# Patient Record
Sex: Female | Born: 1970 | Race: Black or African American | Hispanic: No | Marital: Married | Smoking: Former smoker
Health system: Southern US, Community
[De-identification: ages and names within clinical notes are randomized; demographics above are authoritative.]

## PROBLEM LIST (undated history)

## (undated) DIAGNOSIS — I10 Essential (primary) hypertension: Secondary | ICD-10-CM

## (undated) DIAGNOSIS — D649 Anemia, unspecified: Secondary | ICD-10-CM

## (undated) DIAGNOSIS — N938 Other specified abnormal uterine and vaginal bleeding: Secondary | ICD-10-CM

## (undated) DIAGNOSIS — D219 Benign neoplasm of connective and other soft tissue, unspecified: Secondary | ICD-10-CM

## (undated) DIAGNOSIS — R42 Dizziness and giddiness: Secondary | ICD-10-CM

## (undated) DIAGNOSIS — E559 Vitamin D deficiency, unspecified: Secondary | ICD-10-CM

## (undated) DIAGNOSIS — R7303 Prediabetes: Secondary | ICD-10-CM

## (undated) DIAGNOSIS — E7800 Pure hypercholesterolemia, unspecified: Secondary | ICD-10-CM

## (undated) DIAGNOSIS — T7840XA Allergy, unspecified, initial encounter: Secondary | ICD-10-CM

## (undated) DIAGNOSIS — Z789 Other specified health status: Secondary | ICD-10-CM

## (undated) DIAGNOSIS — Z87891 Personal history of nicotine dependence: Secondary | ICD-10-CM

## (undated) DIAGNOSIS — M549 Dorsalgia, unspecified: Secondary | ICD-10-CM

## (undated) HISTORY — DX: Prediabetes: R73.03

## (undated) HISTORY — DX: Allergy, unspecified, initial encounter: T78.40XA

## (undated) HISTORY — DX: Anemia, unspecified: D64.9

## (undated) HISTORY — DX: Vitamin D deficiency, unspecified: E55.9

## (undated) HISTORY — PX: ABDOMINAL HYSTERECTOMY: SHX81

## (undated) HISTORY — PX: TUBAL LIGATION: SHX77

## (undated) HISTORY — DX: Dizziness and giddiness: R42

## (undated) HISTORY — DX: Pure hypercholesterolemia, unspecified: E78.00

## (undated) HISTORY — DX: Dorsalgia, unspecified: M54.9

## (undated) HISTORY — DX: Essential (primary) hypertension: I10

---

## 1998-04-09 ENCOUNTER — Other Ambulatory Visit: Admission: RE | Admit: 1998-04-09 | Discharge: 1998-04-09 | Payer: Self-pay | Admitting: *Deleted

## 1998-06-26 ENCOUNTER — Ambulatory Visit (HOSPITAL_COMMUNITY): Admission: RE | Admit: 1998-06-26 | Discharge: 1998-06-26 | Payer: Self-pay | Admitting: Gastroenterology

## 1998-09-24 ENCOUNTER — Other Ambulatory Visit: Admission: RE | Admit: 1998-09-24 | Discharge: 1998-09-24 | Payer: Self-pay | Admitting: *Deleted

## 1999-08-12 ENCOUNTER — Emergency Department (HOSPITAL_COMMUNITY): Admission: EM | Admit: 1999-08-12 | Discharge: 1999-08-12 | Payer: Self-pay | Admitting: Emergency Medicine

## 2001-08-29 ENCOUNTER — Encounter: Payer: Self-pay | Admitting: Internal Medicine

## 2001-08-29 ENCOUNTER — Encounter: Admission: RE | Admit: 2001-08-29 | Discharge: 2001-08-29 | Payer: Self-pay | Admitting: Internal Medicine

## 2001-10-17 HISTORY — PX: MYOMECTOMY: SHX85

## 2001-11-21 ENCOUNTER — Other Ambulatory Visit: Admission: RE | Admit: 2001-11-21 | Discharge: 2001-11-21 | Payer: Self-pay | Admitting: Obstetrics and Gynecology

## 2002-09-17 ENCOUNTER — Encounter (INDEPENDENT_AMBULATORY_CARE_PROVIDER_SITE_OTHER): Payer: Self-pay

## 2002-09-17 ENCOUNTER — Inpatient Hospital Stay (HOSPITAL_COMMUNITY): Admission: RE | Admit: 2002-09-17 | Discharge: 2002-09-19 | Payer: Self-pay | Admitting: Obstetrics and Gynecology

## 2002-12-18 ENCOUNTER — Other Ambulatory Visit: Admission: RE | Admit: 2002-12-18 | Discharge: 2002-12-18 | Payer: Self-pay | Admitting: Obstetrics and Gynecology

## 2004-03-03 ENCOUNTER — Other Ambulatory Visit: Admission: RE | Admit: 2004-03-03 | Discharge: 2004-03-03 | Payer: Self-pay | Admitting: Obstetrics and Gynecology

## 2004-05-12 ENCOUNTER — Ambulatory Visit: Admission: RE | Admit: 2004-05-12 | Discharge: 2004-05-12 | Payer: Self-pay | Admitting: *Deleted

## 2004-08-30 ENCOUNTER — Encounter (INDEPENDENT_AMBULATORY_CARE_PROVIDER_SITE_OTHER): Payer: Self-pay | Admitting: Specialist

## 2004-08-30 ENCOUNTER — Ambulatory Visit (HOSPITAL_COMMUNITY): Admission: AD | Admit: 2004-08-30 | Discharge: 2004-08-30 | Payer: Self-pay | Admitting: Obstetrics and Gynecology

## 2005-06-24 ENCOUNTER — Inpatient Hospital Stay (HOSPITAL_COMMUNITY): Admission: AD | Admit: 2005-06-24 | Discharge: 2005-06-24 | Payer: Self-pay | Admitting: Obstetrics & Gynecology

## 2005-07-05 ENCOUNTER — Encounter: Admission: RE | Admit: 2005-07-05 | Discharge: 2005-07-05 | Payer: Self-pay | Admitting: Obstetrics and Gynecology

## 2005-07-09 ENCOUNTER — Inpatient Hospital Stay (HOSPITAL_COMMUNITY): Admission: AD | Admit: 2005-07-09 | Discharge: 2005-07-09 | Payer: Self-pay | Admitting: Obstetrics and Gynecology

## 2005-09-06 ENCOUNTER — Inpatient Hospital Stay (HOSPITAL_COMMUNITY): Admission: RE | Admit: 2005-09-06 | Discharge: 2005-09-08 | Payer: Self-pay | Admitting: Obstetrics and Gynecology

## 2005-09-06 ENCOUNTER — Encounter (INDEPENDENT_AMBULATORY_CARE_PROVIDER_SITE_OTHER): Payer: Self-pay | Admitting: Specialist

## 2006-10-13 ENCOUNTER — Inpatient Hospital Stay (HOSPITAL_COMMUNITY): Admission: AD | Admit: 2006-10-13 | Discharge: 2006-10-14 | Payer: Self-pay | Admitting: Obstetrics & Gynecology

## 2006-10-23 ENCOUNTER — Encounter: Admission: RE | Admit: 2006-10-23 | Discharge: 2006-10-23 | Payer: Self-pay | Admitting: Obstetrics and Gynecology

## 2006-10-26 ENCOUNTER — Inpatient Hospital Stay (HOSPITAL_COMMUNITY): Admission: AD | Admit: 2006-10-26 | Discharge: 2006-10-26 | Payer: Self-pay | Admitting: Obstetrics and Gynecology

## 2006-12-05 ENCOUNTER — Inpatient Hospital Stay (HOSPITAL_COMMUNITY): Admission: AD | Admit: 2006-12-05 | Discharge: 2006-12-08 | Payer: Self-pay | Admitting: Obstetrics and Gynecology

## 2006-12-05 ENCOUNTER — Encounter (INDEPENDENT_AMBULATORY_CARE_PROVIDER_SITE_OTHER): Payer: Self-pay | Admitting: Specialist

## 2007-07-23 ENCOUNTER — Emergency Department (HOSPITAL_COMMUNITY): Admission: EM | Admit: 2007-07-23 | Discharge: 2007-07-23 | Payer: Self-pay | Admitting: Emergency Medicine

## 2007-09-21 ENCOUNTER — Encounter: Admission: RE | Admit: 2007-09-21 | Discharge: 2007-09-21 | Payer: Self-pay | Admitting: Occupational Medicine

## 2010-04-16 ENCOUNTER — Encounter: Admission: RE | Admit: 2010-04-16 | Discharge: 2010-04-16 | Payer: Self-pay | Admitting: Endocrinology

## 2010-04-23 ENCOUNTER — Encounter: Admission: RE | Admit: 2010-04-23 | Discharge: 2010-04-23 | Payer: Self-pay | Admitting: Obstetrics and Gynecology

## 2011-03-04 NOTE — Op Note (Signed)
NAMECAYLAN, [name]          ACCOUNT NO.:  [id number]   MEDICAL RECORD NO.:  [id number]          PATIENT TYPE:  MAT   LOCATION:  MATC                          [facility]ILITY:  [facility]   PHYSICIAN:  Maxie Better, M.D.DATE OF BIRTH:  Mar 18, 1971   DATE OF PROCEDURE:  08/30/2004  DATE OF DISCHARGE:                                 OPERATIVE REPORT   PREOPERATIVE DIAGNOSIS:  Incomplete spontaneous abortion.   PROCEDURE:  Suction dilation and evacuation.   POSTOPERATIVE DIAGNOSES:  Incomplete spontaneous abortion.   ANESTHESIA:  MAC, paracervical block.   SURGEON:  Maxie Better, M.D.   INDICATIONS:  A 39 year old gravida 2, para 0-0-1-0, female with a diagnosis  of missed abortion on August 27, 2004, who presented to the office on  August 30, 2004, with complaint of large clots and ongoing bleeding and  cramps.  Ultrasound in the office revealed that there was still tissue with  large sac within the uterus.  Options were reviewed with the patient.  The  patient opted for surgical management.  Risks and benefits of the procedure  had been explained to the patient and her husband.  The consent was signed.  The patient was transferred to the operating room.   DESCRIPTION OF PROCEDURE:  Under adequate monitored anesthesia, the patient  was placed in the dorsal lithotomy position.  She was sterilely prepped and  draped in the usual fashion.  The bladder was catheterized for a scant  amount of urine.  Examination under anesthesia revealed an irregular, 10  weeks' size, anteverted uterus.  Bivalve speculum was placed in the vagina.  Nesacaine 20 mL of 1% was injected paracervically.  The cervical os was  noted to be visually dilated, probably about 1 cm.  A single-tooth tenaculum  was placed on the anterior lip of the cervix.  The cervix was then serially  dilated up to #31 [facility] dilator.  A #7 mm curved suction cannula was  introduced into the cavity.  Products of conception were  obtained as was a  fair amount of fluid.  The cavity was then curetted, suctioned and when all  tissue was felt to have been removed, all instruments were then removed from  the vagina.  Specimen of products of conception were sent to pathology.  Estimated blood loss was minimal.  Complication was none.  The patient  tolerated the procedure well, was transferred to the recovery room in stable  condition.      [place]/MEDQ  D:  08/30/2004  T:  08/30/2004  Job:  [id number]

## 2011-03-04 NOTE — Op Note (Signed)
Sabrina, Copeland          ACCOUNT NO.:  [id number]   MEDICAL RECORD NO.:  [id number]          PATIENT TYPE:  INP   LOCATION:  9139                          [facility]   PHYSICIAN:  Maxie Better, M.D.DATE OF BIRTH:  Jan 21, 1971   DATE OF PROCEDURE:  09/06/2005  DATE OF DISCHARGE:                                 OPERATIVE REPORT   PREOPERATIVE DIAGNOSES:  1.  Previous myomectomy.  2.  Term gestation.  3.  Class A1 gestational diabetes.   PROCEDURES:  1.  Primary cesarean section, Kerr hysterotomy.  2.  Myomectomy.  3.  Lysis of adhesion.   POSTOPERATIVE DIAGNOSES:  1.  Previous myomectomy.  2.  Fibroid uterus.  3.  Term gestation.  4.  Class A1 gestational diabetes.  5.  Pelvic/Abdominal Adhesions   ANESTHESIA:  Spinal.   SURGEON:  Maxie Better, M.D.   ASSISTANT:  Richardean Sale, M.D.   INDICATIONS:  A 40 year old gravida 3, para 0, female at term with a  previous myomectomy, who is now at term and needs a primary cesarean  section.  Her prenatal course has been complicated by class A1 gestational  diabetes, diet-controlled.  Risks and benefits of the procedure have been  explained to the patient.  Consent was signed.  The patient was transferred  to the operating room.   PROCEDURE:  Under adequate spinal anesthesia, the patient was placed in a  supine position with a left lateral tilt.  She was sterilely prepped and  draped in the usual sterile fashion.  An indwelling Foley catheter was  sterilely placed.  Marcaine 0.25% with 10 mL was injected along the previous  Pfannenstiel skin incision.  The Pfannenstiel skin incision was then made,  carried down to the rectus fascia, rectus fascia incised in the midline and  extended bilaterally.  The rectus fascia was then bluntly and sharply  dissected off the rectus muscle in a superior and inferior fashion.  The  rectus muscle was split in the midline and the parietal peritoneum was  entered bluntly  and extended.  The lower uterine segment was noted to be  developed.  There was a subserosal fibroid noted and a transverse incision  was made to open the bladder flap.  The bladder was then bluntly dissected  off the lower uterine segment and displaced inferiorly using a bladder  retractor.  A curvilinear low transverse uterine incision was then made and  extended bilaterally using bandage scissors.  Artificial rupture of  membranes was done, clear fluid was noted.  A vacuum was subsequently used  to extract a live female infant who had a cord around the neck, but this was  loose and easily reducible, and the same cord around the right foot.  The  baby was delivered, bulb-suctioned on the abdomen.  The cord was clamped,  cut.  The baby was transferred to the waiting pediatricians, who assigned  Apgars of 9 and 9 at one and five minutes.  The placenta was spontaneous  intact.  The uterine cavity was cleaned of debris.  The uterine incision had  no extension.  However, on the right anterior  aspect of the incision a  fibroid was located(submucosal/intramural) in the entire portion of that  incision, which would not facilitate closure without its removal.  A cautery  was then utilized to open the endometrium and enucleate the fibroid from its  base.  The base was then closed with interrupted 0 Vicryl figure-of-eight  sutures within the endometrial cavity.  The uterine incision was then closed  in two layers, the first layer of 0 Monocryl running locked stitch.  A  second layer was imbricated using 0 Monocryl suture.  Bleeding on the right  angle was hemostased with a figure-of-eight suture with 0 Monocryl.  Good  hemostasis was noted.  Tubes and ovaries were noted to be normal  bilaterally.  Right fundal area had a fibroid with three smaller subserosal  fibroids, which were not removed.  There was an adhesion from the right  pelvic sidewall to the midbody of the uterus, which was then cauterized  and  lysed.  The paracolic gutters were cleaned of debris bilaterally.  The  uterine incision had good hemostasis.  The parietal peritoneum was then  closed with 2-0 Vicryl suture.  The undersurface of the rectus fascia was  inspected and small bleeders cauterized.  The rectus fascia was closed with  0 Vicryl x2.  The subcutaneous area as irrigated and small bleeders  cauterized, and the subcutaneous area was approximated using 2-0 plain  sutures.  The skin was then approximated using Ethicon staples.   SPECIMENS:  Placenta, not sent to pathology.  Myoma, sent to pathology.   ESTIMATED BLOOD LOSS:  700 mL.   INTRAOPERATIVE FLUID:  3500 mL crystalloid.   URINE OUTPUT:  150 mL of clear yellow urine.   Sponge and instrument count x2 were correct.  Complication was none.  Weight  of the baby was 8 pounds 1 ounce.  The patient was transferred to the  recovery room in stable condition.      Maxie Better, M.D.  Electronically Signed     /MEDQ  D:  09/06/2005  T:  09/06/2005  Job:  [id number]

## 2011-03-04 NOTE — Discharge Summary (Signed)
[name], [name]          ACCOUNT NO.:  [id number]   MEDICAL RECORD NO.:  [id number]          PATIENT TYPE:  INP   LOCATION:  9139                          [facility]   PHYSICIAN:  Maxie Better, M.D.DATE OF BIRTH:  05-15-1971   DATE OF ADMISSION:  09/06/2005  DATE OF DISCHARGE:  09/08/2005                                 DISCHARGE SUMMARY   ADMITTING DIAGNOSES:  1.  Previous myomectomy.  2.  Term gestation.  3.  Fibroid uterus.  4.  Class A1 gestational diabetes.   DISCHARGE DIAGNOSES:  1.  Term gestation, delivered.  2.  Class A1 gestational diabetes.  3.  Previous myomectomy.  4.  Fibroid uterus.  5.  Postoperative anemia.   PROCEDURE:  1.  Primary cesarean section.  2.  Myomectomy.  3.  Lysis of adhesions.   HISTORY OF PRESENT ILLNESS:  A 40 year old G79, P0 married black female at  term with a previous myomectomy, class A1 gestational diabetes at term  admitted for primary cesarean section due to inability to labor.  Patient is  known to have multiple uterine fibroids.  Her prenatal course had been  complicated by class A1 gestational diabetes and a preterm cervical change.   HOSPITAL COURSE:  The patient was admitted to [facility] for primary  cesarean section due to the inability to labor secondary to previous  myomectomy.  She was taken to the operating room where she delivered a live  female 8 pounds 1 ounce, Apgars of 9 and 9 via a low transverse uterine  incision.  There was a submucosal intramural fibroid present in the incision  line which necessitated removal in order to facilitate closure of the  uterine incision.  This myoma was removed and sent to pathology.  Incision  of the uterus was otherwise closed without incident.  There was cord around  the neck x1 and on the right foot of that baby at time of delivery.  The  patient had an uncomplicated postoperative course.  By postoperative day #2  she was tolerating a regular diet and was  requesting discharge.  Incision  had no erythema, induration, or exudate.  Her CBC on postoperative day #1  showed a hemoglobin of 9.9, hematocrit 30.7, white count 7.9.  She was  deemed well to be discharged home.   DISPOSITION:  Home.   CONDITION ON DISCHARGE:  Stable.   DISCHARGE FOLLOW-UP:  For staple removal in the office the next day and  follow-up at [facility] otherwise for postpartum p.r.n. four to six  weeks.   DISCHARGE INSTRUCTIONS:  Per the postpartum booklet given.   DISCHARGE MEDICATIONS:  1.  Motrin 600 mg every six hours p.r.n. pain.  2.  Prenatal vitamins one p.o. daily.  3.  Tylox one to two tablets every four to six hours p.r.n. pain.   The patient will have two-hour glucose tolerance test at eight weeks  postpartum check.      Maxie Better, M.D.  Electronically Signed     [place]/MEDQ  D:  09/26/2005  T:  09/26/2005  Job:  [id number]

## 2011-03-04 NOTE — Op Note (Signed)
NAME:  [name], [name]                    ACCOUNT NO.:  [id number]   MEDICAL RECORD NO.:  [id number]                   PATIENT TYPE:  INP   LOCATION:  9312                                 [facility]   PHYSICIAN:  Maxie Better, M.D.            DATE OF BIRTH:  10-01-1971   DATE OF PROCEDURE:  09/17/2002  DATE OF DISCHARGE:                                 OPERATIVE REPORT   PREOPERATIVE DIAGNOSES:  1. Pelvic pain.  2. Fibroid uterus.   PROCEDURES:  1. Examination under anesthesia.  2. Exploratory laparotomy.  3. Multiple myomectomies.   POSTOPERATIVE DIAGNOSES:  1. Pelvic pain.  2. Pedunculated fibroids.  3. Subserosal, intramural, and submucosal fibroids.   ANESTHESIA:  General.   SURGEON:  Maxie Better, M.D.   ASSISTANT:  Pershing Cox, M.D.   INDICATIONS FOR PROCEDURE:  This is a 40 year old gravida 1, para 0-0-1-0,  married black female with multiple uterine fibroids who has associated  pelvic pain and who now presents for surgical management.  Risks and  benefits of the procedure have been explained to the patient.  Consent was  signed.  The patient was transferred to the operating room.   PROCEDURE:  Under adequate general anesthesia, the patient was placed in the  supine position.  Examination under anesthesia revealed about a 14-week size  irregular uterus.  The patient was thoroughly prepped and draped including  the vagina, which was also prepped.  An indwelling Foley catheter was placed  sterilely.  A bivalve speculum was placed in the vagina.  A single-tooth  tenaculum was placed on the anterior lip of the cervix.  An acorn cannula  was introduced into the cervical os and attached to the tenaculum for  transmission of the dilute solution of methylene blue for ascertaining  endometrial cavity entry during the surgery.  The bivalve speculum was then  removed.  The patient having now been sterilely prepped and draped underwent  a  Pfannenstiel skin incision after 0.25% Marcaine was injected along the  planned incision line and skin incision was then made, carried down to the  rectus fascia using Bovie cautery.  The rectus fascia was incised in the  midline and extended bilaterally.  The rectus fascia was bluntly and with  cautery dissected off the rectus muscle in superior and inferior fashion.  The rectus muscle was split in the midline.  The parietal peritoneum was  entered sharply and extended superiorly, inferiorly, and slightly lateral on  the right.  Inspection of the pelvis was notable for the fibroid that had  multiple pedunculated, fundal, anterior, and posterior fibroids.  The  largest pedunculated fibroid was at least 6 cm arising from the left  anterior fundal region close to the insertion of the fallopian tube.  The  uterus was then exteriorized.  Additional fibroids were noted of similar  size anteriorly and pedunculated as well as posterior and superior 3-4 cm  fibroids.  There was  a palpable right lower uterine segment close to the  cervical portion of a fibroid about 3.5 cm.  There was a lower uterine  segment anterior fibroid.  There were multiple smaller pedunculated fibroids  about a cm and then there were two palpable intramural/subserosal fibroids  palpable anteriorly.  Both ovaries are normal.  Both tubes appeared normal.  A dilute solution of Pitressin was injected on the base of the pedunculated  fibroids taking care to make sure that they were not close to the entry of  the fallopian tubes.  The pedunculated fibroids x3 were removed anteriorly.  There was a posterior pedunculated fibroid to the right adjacent to the  entry of the right fallopian tube and this was also carefully removed.  A  posterior fundal incision was made.  Several additional fibroids then  removed and a deeper fibroid was removed.  At this point, the methylene blue  was injected and that confirmed entry into the  endometrial cavity.  An  anterior vertical incision was made.  Multiple fibroids were removed through  that incision and the lower uterine segment fibroid was removed  subsequently, with additional fibroids removed from its base.  The right  posterior lower uterine segment fibroid was also removed and all these  fibroids having then being removed was closed, the deeper layers with 0  Vicryl figure-of-eight sutures and the skin approximated using 2-0 Monocryl  sutures.  Where not possible, the closure was done with primarily 0 Vicryl  suture in a baseball fashion.  Good hemostasis subsequently noted.  No other  fibroids were palpated.  The abdomen was then irrigated copiously.  Suction  of debris and Intercede placed anteriorly and posteriorly to protect the  tube as well as to decrease adhesions.  Due to the endometrial cavity being  entered, the patient will need a cesarean section for all future deliveries.  The parietal peritoneum was now closed.  The rectus fascia after being  inspected was closed with 0 Vicryl x2.  The subcutaneous area was irrigated,  suctioned, small bleeders cauterized, and the skin approximated using  Ethicon staples.  The specimens were myoma x23.  Estimated blood loss was  350 cc.  Intraoperative fluid was 1800 cc crystalloid.  Urine output was 150  cc of bluish colored urine consistent with methylene blue utilized during  her surgery.  Sponge and instrument counts x2 are correct.  Complications  none.  The patient tolerated the procedure well, was transferred to the  recovery room in stable condition.                                               Maxie Better, M.D.    [place]/MEDQ  D:  09/17/2002  T:  09/17/2002  Job:  [id number]

## 2011-03-04 NOTE — Discharge Summary (Signed)
[name], [name]          ACCOUNT NO.:  [id number]   MEDICAL RECORD NO.:  [id number]          PATIENT TYPE:  INP   LOCATION:  9104                          [facility]   PHYSICIAN:  Maxie Better, M.D.DATE OF BIRTH:  04-28-1971   DATE OF ADMISSION:  12/05/2006  DATE OF DISCHARGE:  12/08/2006                               DISCHARGE SUMMARY   ADMISSION DIAGNOSES:  1. Previous cesarean section,  2. desires sterilization.  3. Class A-1 gestational diabetes.  4. Term gestation   DISCHARGE DIAGNOSES:  1. Term gestation, delivered.  2. Class A-1 gestational diabetes.  3. Desires sterilization.  4. Previous cesarean section.  5. Fibroid uterus.   PROCEDURE:  1. Repeat cesarean section.  2. Modified Pomeroy tubal ligation.   HISTORY OF PRESENT ILLNESS:  A 40 year old gravida 4, para 1-0-2-1  married black female with a previous cesarean section due to prior  myomectomy, who is now a term for repeat cesarean section. The patient  also desires permanent sterilization. Her prenatal care has been  complicated by gestational diabetes, diet controlled   HOSPITAL COURSE:  The patient was admitted to [facility]. She was taken to the operating room. Repeat cesarean section  was performed as well as a modified Pomeroy tubal ligation. The patient  had a live female, 7 pounds 9 ounces, Apgar's of 9 and 9. A thin lower  uterine segment was noted at the time. A small right fundal sub-serosal  fibroid was noted. The right fallopian tube was adherent to the right  ovary and had to be lysed in order to perform the tubal ligation, but it  was otherwise performed. Postoperatively, the patient did well. She was  placed back on a regular diet.  A CBC on postoperative day 1 showed a hemoglobin of 11, hematocrit 32.2,  white count of 8.1, platelet count of 269,000. The pathology was  consistent with a segment of fallopian tube. By postoperative day 3, the  patient  was feeling well and was able to be discharged home. Her  incision showed no evidence of induration or exudate.   DISPOSITION:  home.   CONDITION ON DISCHARGE:  Stable.   DISCHARGE MEDICATIONS:  1. Percocet 1 tablet every 6 hours p.r.n. pain.  2. Ibuprofen 800 mg 1 p.o. q.8 hours p.r.n. pain.  3. Prenatal vitamins 1 p.o. daily.   DISCHARGE INSTRUCTIONS:  Per the post-partum booklet given.   FOLLOWUP:  At [facility] on Tuesday for staple removal and 6 weeks  post-partum otherwise.      Maxie Better, M.D.  Electronically Signed     [place]/MEDQ  D:  12/24/2006  T:  12/24/2006  Job:  [id number]

## 2011-03-04 NOTE — Consult Note (Signed)
[name], [name]          ACCOUNT NO.:  [id number]   MEDICAL RECORD NO.:  [id number]          PATIENT TYPE:  MAT   LOCATION:  MATC                          [facility]   PHYSICIAN:  Lenoard Aden, M.D.DATE OF BIRTH:  1970-11-05   DATE OF CONSULTATION:  DATE OF DISCHARGE:                                   CONSULTATION   CHIEF COMPLAINT:  Preterm labor.   HISTORY OF PRESENT ILLNESS:  She is a 40 year old African-American female  G3, P0, EDD of September 13, 2005 at 32-weeks gestation with preterm  contractions. She does have a history of preterm labor without cervical  change. She has not had betamethasone. She does have a history of borderline  Carbohydrate intolerance.   ALLERGIES:  She has no known drug allergies.   MEDICATIONS:  Prenatal vitamins.   HISTORY:  D&C in 1993. SAB with D&C in 2005. History of myomectomy in 2003  with questionable entry into the active saline, requiring a primary C-  section.   SOCIAL HISTORY:  She is a nonsmoker, non drinker. She denies domestic  physical violence.   FAMILY HISTORY:  Remarkable for heart disease.   PRENATAL LABS:  Blood type B positive. Rubella immune. Hepatitis  nonreactive. HIV declined. GBS has not been performed.   PHYSICAL EXAMINATION:  GENERAL:  She is a well-developed, well-nourished  African-American female in no acute distress.  HEENT:  Normal.  LUNGS:  Clear.  HEART:  Regular rhythm.  ABDOMEN:  Soft, gravid and nontender.  PELVIC:  Cervix is closed, 2.5 cm long. Presenting part is out of the  pelvis.  EXTREMITIES:  Show no cords.  NEUROLOGICAL EXAM:  Nonfocal.   IMPRESSION:  Thirty-two week intrauterine pregnancy with preterm  contractions, no cervical change.   PLAN:  Discharge home. Continue bed rest. Follow up in the office in 1 week.  Continue glucose management as per Dr. Cherly Hensen.      Lenoard Aden, M.D.  Electronically Signed     RJT/MEDQ  D:  07/09/2005  T:  07/09/2005  Job:   [id number]

## 2011-03-04 NOTE — H&P (Signed)
NAME:  [name], KITCHEN                       ACCOUNT NO.:  [id number]   MEDICAL RECORD NO.:  [id number]                   PATIENT TYPE:   LOCATION:                                       FACILITY:   PHYSICIAN:  Maxie Better, M.D.            DATE OF BIRTH:   DATE OF ADMISSION:  DATE OF DISCHARGE:                                HISTORY & PHYSICAL   CHIEF COMPLAINT:  Right lower quadrant pain, fibroid uterus.   HISTORY OF PRESENT ILLNESS:  This is a 40 year old gravida 1, para 0, 0, 1,  0, married black female, last menstrual period 08/26/02; who is now being  admitted for exploratory laparotomy, myomectomy secondary to pain, secondary  to her uterine fibroids. The patient has been on birth control pills until  decision was made for surgery.  The patient was seen in [facility]  by Dr. Merla Riches for pelvic pain. At that time, she had no nausea and  vomiting, or fever.  She had had some breakthrough bleeding as a result of  trying to suppress her cycle due to plans for honeymoon. Bowel movements  have been regular.  The patient had been using nonsteroidals for her pain  with marginal relief. The pain was nonradiating and has been on and off  since 08/06/02.  Ultrasound on 08/16/02 showed uterus that was 14.8 x 7.6 x  8.4 cm with multiple fibroids. Both ovaries were normal.  There was a  pedunculated fibroid measuring about 5 cm.  The patient would like to  proceed with surgical management.   PAST MEDICAL HISTORY:  Previous treatment for H. pylori.  D&C.   OBSTETRICAL HISTORY:  First trimester abortion, no problems.  GYN:  Fibroid  uterus, no problems.   ALLERGIES:  NO KNOWN DRUG ALLERGIES.   FAMILY HISTORY:  Maternal grandmother has hypertension.  No genital, colon  or breast cancer.   MEDICATIONS:  Multivitamins.   SOCIAL HISTORY:  Recently married.  Information systems major, working at  [employer]   REVIEW OF SYSTEMS:  Negative except as noted in  history of present illness.   PHYSICAL EXAMINATION:  VITAL SIGNS:  Blood pressure 118/66, pulse 80, weight  187 pounds.  GENERAL:  A well-developed, well nourished black female in no acute  distress.  SKIN:  No lesions.  HEENT:  Anicteric sclerae, pink conjunctivae, oropharynx negative.  LUNGS:  Clear to auscultation.  NODES:  No axillary or supraclavicular nodes palpable.  CARDIOVASCULAR:  Regular rate and rhythm without murmur.  BREASTS:  Soft, nontender, no palpable mass.  ABDOMEN:  Soft, uterus about 2 fingerbreadths below the umbilicus,  nontender.  PELVIC:  Vulva showed no lesions, vagina has no discharge, cervix closed,  uterus about 16 weeks' size, irregular. Adenex not palpable, given the  increased uterine size.  RECTAL:  Deferred.  EXTREMITIES:  No calf tenderness or edema.  BACK:  No CVA tenderness.   IMPRESSION:  Pelvic pain, fibroid  uterus.   PLAN:  Admission, exploratory laparotomy, myomectomy, antibiotic  prophylaxis, antiembolic stockings. The risks reviewed with the patient,  included, but not limited to infection, bleeding which may require blood  transfusion, risk of blood transfusions such as acute reaction, hepatitis  transmission 1 out of 3000, HIV transmission 1 out of 100,000 were  discussed, up to 30% chance of needing myomectomy in the future or  hysterectomy in the future, internal scar tissue which may cause infertility  and pelvic pain, bowel obstruction, injury to surrounding organs and  structures, possible need for cesarean section for future delivery;  Postop  criteria and criteria for hospital discharge reviewed. Patient also informed  that there is about a 2% chance of a need for hysterectomy. Also discussed  possible finding of adenomyoma. All questions were answered.                                                 Maxie Better, M.D.    [place]/MEDQ  D:  09/15/2002  T:  09/15/2002  Job:  [id number]

## 2011-03-04 NOTE — H&P (Signed)
[name], [name]          ACCOUNT NO.:  [id number]   MEDICAL RECORD NO.:  [id number]          PATIENT TYPE:  INP   LOCATION:  NA                            [facility]ILITY:  [facility]   PHYSICIAN:  Maxie Better, M.D.DATE OF BIRTH:  [birth year]   DATE OF ADMISSION:  09/06/2005  DATE OF DISCHARGE:                                HISTORY & PHYSICAL   CHIEF COMPLAINT:  Previous myomectomy, C-section scheduled.   HISTORY OF PRESENT ILLNESS:  This is a 40 year old G3, P0-0-2-0, married  black female, EDC of September 13, 2005, by ultrasound, who is now at 50  weeks' gestation, being admitted for a primary cesarean section secondary to  previous myomectomy.  The patient's history is notable for multiple  fibroids.  Her prenatal course has been complicated by class A1 gestational  diabetes, diet-controlled.  She has had good fetal movements, no  contractions, intact membranes.   PRENATAL CARE:  [facility], obstetrician Maxie Better, M.D.   PRENATAL LABORATORY DATA:  Blood type is B positive, antibody screen is  negative.  Hemoglobin electrophoresis is normal.  RPR is nonreactive.  Rubella is immune.  Hepatitis B surface antigen is negative.  HIV test was  declined.  GC and chlamydia cultures were negative.  One-hour glucose test  was 168, three-hour GTT was abnormal.  AFP for open neural tube defect was  normal.  Normal ultra screen.  GBS culture was done on October 27.  Anatomic  fetal survey done at 20 weeks was normal with the exception of shortened  cervical length, which was followed with the patient closely without any  further change.   PAST MEDICAL HISTORY:  No known drug allergies.   Medicines are prenatal vitamins, Ambien.   Medical history:  Uterine fibroids, migraine.   Surgical history:  Myomectomy in 2003.  D&E x2.   OBSTETRIC HISTORY:  SAB November 2005 and 1993.   FAMILY HISTORY:  Noncontributory.   SOCIAL HISTORY:  Married, nonsmoker.  Billing service  coordinator, [employer].   REVIEW OF SYSTEMS:  Negative.   PHYSICAL EXAMINATION:  GENERAL:  A well-developed, well-nourished, gravid  black female in no acute distress.  VITAL SIGNS:  Blood pressure 110/70, fetal heart rate was 136.  SKIN:  No lesions.  HEENT:  Anicteric sclerae, pink conjunctivae.  Oropharynx negative.  CARDIAC:  Regular rate and rhythm without murmur.  CHEST:  Lungs were clear to auscultation.  BREASTS:  Soft, nontender, no palpable mass.  ABDOMEN:  Gravid.  A low transverse scar noted.  PELVIC:  Cervix is closed, about 2 cm long.  Presenting part out of the  pelvis.  EXTREMITIES:  1+ edema.   IMPRESSION:  1.  Term gestation.  2.  Previous myomectomy requiring cesarean section for delivery.  3.  Class A1 gestational diabetes.   PLAN:  Admission, primary cesarean section, routine admission labs.  The  risks of the procedure were explained to the patient, including but not  limited to infection, bleeding, injury to the surrounding organ structures  such as the bladder, bowel or ureter, internal scar tissue from previous  surgery which may cause pain in  the future and/or bowel obstruction,  cesarean section necessary in the future, possible need for blood  transfusion.  The risks of blood transfusion including HIV transmission,  acute reaction, hepatitis reviewed.  Postop care and criteria for discharge  were discussed, all questions answered.      Maxie Better, M.D.  Electronically Signed     [place]/MEDQ  D:  09/06/2005  T:  09/06/2005  Job:  [id number]

## 2011-03-04 NOTE — Op Note (Signed)
[name], [name]          ACCOUNT NO.:  [id number]   MEDICAL RECORD NO.:  [id number]          PATIENT TYPE:  INP   LOCATION:  9104                          FACILITY:  [facility]   PHYSICIAN:  Maxie Better, M.D.DATE OF BIRTH:  04-28-1971   DATE OF PROCEDURE:  12/05/2006  DATE OF DISCHARGE:                               OPERATIVE REPORT   PREOPERATIVE DIAGNOSIS:  Previous cesarean section, class A1 gestational  diabetes, term gestation, desires sterilization.   PROCEDURE:  Repeat cesarean section Sharl Ma hysterotomy, modified Pomeroy  tubal ligation.   POSTOPERATIVE DIAGNOSIS:  Previous cesarean section, desires  sterilization, class A1 gestational diabetes, term gestation   ANESTHESIA:  Spinal.   SURGEON:  Maxie Better, M.D.   ASSISTANT:  Marlinda Mike, C.N.M.   PROCEDURE:  Under adequate spinal anesthesia, the patient was placed in  the supine position with a left lateral tilt.  She was sterilely prepped  and draped in the usual fashion.  An indwelling Foley catheter was  sterilely placed.  10 mL of 0.25% Marcaine was injected along the  previous Pfannenstiel skin incision.  The Pfannenstiel skin incision was  then made and carried down to the rectus fascia.  The rectus fascia was  opened transversely.  The rectus fascia was carefully dissected off the  rectus muscle in a superior and inferior fashion.  The rectus muscle was  then split in the midline.  The parietal peritoneum was carefully  entered after adhesions were lysed.  On entering the abdominal cavity,  the uterus was noted to have a very thin lower uterine segment.  An  attempt at developing the vesicouterine peritoneum was unsuccessful due  to the bladder being adherent to the lower uterine segment. Looking at  the bladder reflection, a transverse incision was then made superiorly  and extended with bandage scissors.  Copious clear amniotic fluid was  subsequently noted.  Subsequent delivery of a live  female was then  accomplished.  The baby was bulb suctioned on the abdomen.  The cord was  clamped, cut, and the baby was transferred to the awaiting pediatricians  who assigned Apgars of 9 and 9 at 1 and 5 minutes.  The placenta, which  was spontaneous, was passed off. The uterine cavity was cleaned of  debris.  Exploration of the uterine cavity did not reveal any palpable  intracavitary or submucosal fibroids.  The uterine incision had no  extension.  The uterine incision was closed with a single layer running  lock stitch of 0 Monocryl suture.   Attention was then turned to the fallopian tubes.  On the right fundal  aspect of the uterus was a small subserosal fibroids.  The left  fallopian tube and ovary was identified and was noted to be normal.  The  mid portion of the left fallopian tube was grasped with a Babcock, the  underlying mesosalpinx was opened.  The proximal and distal portion of  the tube, at that point, was tied with 0 chromic x2 proximally and  distally, and the intervening segment of tube was then removed. On the  right, the fallopian tube was identified to its fimbriated  end, however,  was adherent to the right ovary with the ovary itself was otherwise  normal.  After sharp dissection of the tube off of the right ovary, the  mesosalpinx was then identified, an opening was then placed with  cautery.  The proximal and distal portion of the tube was then tied with  0 chromic x2, and the intervening segment of tube was then removed.  Reinspection of the uterine incision showed good hemostasis.  There was  a small bleeding site on the left which was hemostased with interrupted  0 Monocryl figure-of-eight sutures.  The abdomen was then irrigated and  suctioned of debris.  The bladder was inspected and was noted to be low  on the field.  Good hemostasis was noted.  The parietal peritoneum was  not closed as the bladder was adherent in  in that area inferiorly.  The  rectus  fascia was closed with 0 Vicryl x2, the subcutaneous areas were  irrigated, small bleeders were cauterized.  Interrupted 2-0 plain  sutures were then placed in the subcutaneous area.  The skin was  approximated using Ethicon staples.   SPECIMENS:  Placenta not sent to pathology. Portion of right and left  fallopian tube sent to pathology.   ESTIMATED BLOOD LOSS:  700 mL.   INTEROPERATIVE FLUIDS:  3 liters.   URINE OUTPUT:  300 mL clear yellow urine.   Weight of the baby was 7 pounds 9 ounces.  Sponge and instrument counts  x2 was correct.  Complications were none.  The patient tolerated the  procedure well and was transferred to the recovery room in stable  condition.      Maxie Better, M.D.  Electronically Signed     [place]/MEDQ  D:  12/05/2006  T:  12/05/2006  Job:  [id number]

## 2011-03-28 ENCOUNTER — Other Ambulatory Visit: Payer: Self-pay | Admitting: Obstetrics and Gynecology

## 2011-03-28 DIAGNOSIS — Z1231 Encounter for screening mammogram for malignant neoplasm of breast: Secondary | ICD-10-CM

## 2011-05-02 ENCOUNTER — Other Ambulatory Visit: Payer: Self-pay | Admitting: Obstetrics and Gynecology

## 2011-05-02 ENCOUNTER — Ambulatory Visit
Admission: RE | Admit: 2011-05-02 | Discharge: 2011-05-02 | Disposition: A | Discharge status: Home / Self Care | Payer: BC Managed Care – PPO | Source: Ambulatory Visit | Attending: Obstetrics and Gynecology | Admitting: Obstetrics and Gynecology

## 2011-05-02 DIAGNOSIS — Z1231 Encounter for screening mammogram for malignant neoplasm of breast: Secondary | ICD-10-CM

## 2011-06-29 ENCOUNTER — Other Ambulatory Visit: Payer: Self-pay | Admitting: Obstetrics and Gynecology

## 2011-07-05 ENCOUNTER — Encounter (HOSPITAL_COMMUNITY)
Admission: RE | Admit: 2011-07-05 | Discharge: 2011-07-05 | Disposition: A | Discharge status: Home / Self Care | Payer: BC Managed Care – PPO | Source: Ambulatory Visit | Attending: Obstetrics and Gynecology | Admitting: Obstetrics and Gynecology

## 2011-07-05 ENCOUNTER — Encounter (HOSPITAL_COMMUNITY): Payer: Self-pay

## 2011-07-05 HISTORY — DX: Benign neoplasm of connective and other soft tissue, unspecified: D21.9

## 2011-07-05 HISTORY — DX: Other specified abnormal uterine and vaginal bleeding: N93.8

## 2011-07-05 HISTORY — DX: Other specified health status: Z78.9

## 2011-07-05 LAB — BASIC METABOLIC PANEL
BUN: 12 mg/dL (ref 6–23)
CO2: 25 mEq/L (ref 19–32)
Chloride: 105 mEq/L (ref 96–112)
GFR calc Af Amer: >60 mL/min (ref >60)
Glucose, Bld: 100 mg/dL — ABNORMAL HIGH (ref 70–99)
Potassium: 3.8 mEq/L (ref 3.5–5.1)

## 2011-07-05 LAB — CBC
HCT: 33 % — ABNORMAL LOW (ref 36.0–46.0)
Hemoglobin: 10 g/dL — ABNORMAL LOW (ref 12.0–15.0)
MCHC: 30.3 g/dL (ref 30.0–36.0)
MCV: 77.8 fL — ABNORMAL LOW (ref 78.0–100.0)
WBC: 5.6 10*3/uL (ref 4.0–10.5)

## 2011-07-05 LAB — SURGICAL PCR SCREEN: Staphylococcus aureus: POSITIVE — AB

## 2011-07-05 NOTE — Patient Instructions (Signed)
   Your procedure is scheduled on:07/12/11  Enter through the Main Entrance of [facility] at:0600 Pick up the phone at the desk and dial [phone]  Please call this number if you have any problems the morning of surgery: [phone]  Remember: Do not eat food after midnight  Do not drink clear liquids after:midnight Take these medicines the morning of surgery with a SIP OF WATER:none  Do not wear jewelry, make-up, or FINGER nail polish Do not wear lotions, powders, or perfumes. Do not shave 48 hours prior to surgery. Do not bring valuables to the hospital. Leave suitcase in the car. After Surgery it may be brought to your room. For patients being admitted to the hospital, checkout time is 11:00am the day of discharge.  Patients discharged on the day of surgery will not be allowed to drive home.   Name and phone number of your driver:[name], mother- [phone]   Remember to use your hibiclens as instructed.

## 2011-07-11 MED ORDER — CEFAZOLIN SODIUM-DEXTROSE 2-3 GM-% IV SOLR
2.0000 g | INTRAVENOUS | Status: AC
  Administered 2011-07-12: 2 g via INTRAVENOUS
  Filled 2011-07-11: qty 50

## 2011-07-12 ENCOUNTER — Ambulatory Visit (HOSPITAL_COMMUNITY)
Admission: RE | Admit: 2011-07-12 | Discharge: 2011-07-13 | Disposition: A | Discharge status: Home / Self Care | Payer: BC Managed Care – PPO | Source: Ambulatory Visit | Attending: Obstetrics and Gynecology | Admitting: Obstetrics and Gynecology

## 2011-07-12 ENCOUNTER — Encounter (HOSPITAL_COMMUNITY)
Admission: RE | Disposition: A | Discharge status: Home / Self Care | Payer: Self-pay | Source: Ambulatory Visit | Attending: Obstetrics and Gynecology

## 2011-07-12 ENCOUNTER — Encounter (HOSPITAL_COMMUNITY): Payer: Self-pay | Admitting: *Deleted

## 2011-07-12 ENCOUNTER — Encounter (HOSPITAL_COMMUNITY): Payer: Self-pay | Admitting: Anesthesiology

## 2011-07-12 ENCOUNTER — Other Ambulatory Visit: Payer: Self-pay | Admitting: Obstetrics and Gynecology

## 2011-07-12 ENCOUNTER — Ambulatory Visit (HOSPITAL_COMMUNITY): Payer: BC Managed Care – PPO | Admitting: Anesthesiology

## 2011-07-12 DIAGNOSIS — Z01812 Encounter for preprocedural laboratory examination: Secondary | ICD-10-CM | POA: Insufficient documentation

## 2011-07-12 DIAGNOSIS — D259 Leiomyoma of uterus, unspecified: Secondary | ICD-10-CM | POA: Insufficient documentation

## 2011-07-12 DIAGNOSIS — Z87891 Personal history of nicotine dependence: Secondary | ICD-10-CM

## 2011-07-12 DIAGNOSIS — Z90710 Acquired absence of both cervix and uterus: Secondary | ICD-10-CM | POA: Diagnosis not present

## 2011-07-12 DIAGNOSIS — Z01818 Encounter for other preprocedural examination: Secondary | ICD-10-CM | POA: Insufficient documentation

## 2011-07-12 DIAGNOSIS — N946 Dysmenorrhea, unspecified: Secondary | ICD-10-CM | POA: Insufficient documentation

## 2011-07-12 DIAGNOSIS — N920 Excessive and frequent menstruation with regular cycle: Secondary | ICD-10-CM | POA: Diagnosis present

## 2011-07-12 HISTORY — DX: Personal history of nicotine dependence: Z87.891

## 2011-07-12 LAB — BASIC METABOLIC PANEL
CO2: 27 mEq/L (ref 19–32)
Calcium: 9 mg/dL (ref 8.4–10.5)
Chloride: 101 mEq/L (ref 96–112)
Glucose, Bld: 119 mg/dL — ABNORMAL HIGH (ref 70–99)
Sodium: 137 mEq/L (ref 135–145)

## 2011-07-12 SURGERY — ROBOTIC ASSISTED TOTAL HYSTERECTOMY
Anesthesia: General | Site: Abdomen | Wound class: Clean Contaminated

## 2011-07-12 MED ORDER — DOCUSATE SODIUM 100 MG PO CAPS: 100.0000 mg | ORAL_CAPSULE | Freq: Every day | ORAL | Status: DC

## 2011-07-12 MED ORDER — HYDROMORPHONE HCL 1 MG/ML IJ SOLN: 0.2000 mg | INTRAMUSCULAR | Status: DC | PRN

## 2011-07-12 MED ORDER — CEFAZOLIN SODIUM 1-5 GM-% IV SOLN
1.0000 g | Freq: Three times a day (TID) | INTRAVENOUS | Status: AC
  Administered 2011-07-12 (×2): 1 g via INTRAVENOUS
  Filled 2011-07-12 (×2): qty 50

## 2011-07-12 MED ORDER — ROCURONIUM BROMIDE 50 MG/5ML IV SOLN
INTRAVENOUS | Status: AC
  Filled 2011-07-12: qty 1

## 2011-07-12 MED ORDER — ONDANSETRON HCL 4 MG/2ML IJ SOLN
INTRAMUSCULAR | Status: AC
  Filled 2011-07-12: qty 2

## 2011-07-12 MED ORDER — KETOROLAC TROMETHAMINE 30 MG/ML IJ SOLN
15.0000 mg | Freq: Once | INTRAMUSCULAR | Status: AC | PRN
  Administered 2011-07-12: 30 mg via INTRAVENOUS

## 2011-07-12 MED ORDER — ROCURONIUM BROMIDE 100 MG/10ML IV SOLN
INTRAVENOUS | Status: DC | PRN
  Administered 2011-07-12: 10 mg via INTRAVENOUS
  Administered 2011-07-12: 5 mg via INTRAVENOUS
  Administered 2011-07-12 (×2): 10 mg via INTRAVENOUS
  Administered 2011-07-12: 5 mg via INTRAVENOUS
  Administered 2011-07-12 (×5): 10 mg via INTRAVENOUS
  Administered 2011-07-12: 30 mg via INTRAVENOUS

## 2011-07-12 MED ORDER — HYDROMORPHONE HCL 1 MG/ML IJ SOLN
0.2500 mg | INTRAMUSCULAR | Status: DC | PRN
  Administered 2011-07-12: 0.5 mg via INTRAVENOUS

## 2011-07-12 MED ORDER — ONDANSETRON HCL 4 MG PO TABS: 4.0000 mg | ORAL_TABLET | Freq: Four times a day (QID) | ORAL | Status: DC | PRN

## 2011-07-12 MED ORDER — PROPOFOL 10 MG/ML IV EMUL
INTRAVENOUS | Status: AC
  Filled 2011-07-12: qty 20

## 2011-07-12 MED ORDER — LACTATED RINGERS IR SOLN
Status: DC | PRN
  Administered 2011-07-12: 3000 mL

## 2011-07-12 MED ORDER — KETOROLAC TROMETHAMINE 30 MG/ML IJ SOLN
30.0000 mg | Freq: Four times a day (QID) | INTRAMUSCULAR | Status: DC
  Administered 2011-07-12 – 2011-07-13 (×3): 30 mg via INTRAVENOUS
  Filled 2011-07-12 (×3): qty 1

## 2011-07-12 MED ORDER — KETOROLAC TROMETHAMINE 30 MG/ML IJ SOLN: 30.0000 mg | Freq: Four times a day (QID) | INTRAMUSCULAR | Status: DC

## 2011-07-12 MED ORDER — ONDANSETRON HCL 4 MG/2ML IJ SOLN: 4.0000 mg | Freq: Four times a day (QID) | INTRAMUSCULAR | Status: DC | PRN

## 2011-07-12 MED ORDER — BISACODYL 10 MG RE SUPP: 10.0000 mg | Freq: Every day | RECTAL | Status: DC | PRN

## 2011-07-12 MED ORDER — SENNOSIDES-DOCUSATE SODIUM 8.6-50 MG PO TABS: 2.0000 | ORAL_TABLET | Freq: Every day | ORAL | Status: DC | PRN

## 2011-07-12 MED ORDER — MIDAZOLAM HCL 5 MG/5ML IJ SOLN
INTRAMUSCULAR | Status: DC | PRN
  Administered 2011-07-12: 2 mg via INTRAVENOUS

## 2011-07-12 MED ORDER — OXYCODONE-ACETAMINOPHEN 5-325 MG PO TABS
1.0000 | ORAL_TABLET | ORAL | Status: DC | PRN
  Administered 2011-07-13: 2 via ORAL
  Filled 2011-07-12: qty 2

## 2011-07-12 MED ORDER — PHENYLEPHRINE HCL 10 MG/ML IJ SOLN
INTRAMUSCULAR | Status: DC | PRN
  Administered 2011-07-12: 80 ug via INTRAVENOUS

## 2011-07-12 MED ORDER — NEOSTIGMINE METHYLSULFATE 1 MG/ML IJ SOLN
INTRAMUSCULAR | Status: AC
  Filled 2011-07-12: qty 10

## 2011-07-12 MED ORDER — DIPHENHYDRAMINE HCL 50 MG/ML IJ SOLN: 12.5000 mg | Freq: Four times a day (QID) | INTRAMUSCULAR | Status: DC | PRN

## 2011-07-12 MED ORDER — SIMETHICONE 80 MG PO CHEW: 80.0000 mg | CHEWABLE_TABLET | Freq: Four times a day (QID) | ORAL | Status: DC | PRN

## 2011-07-12 MED ORDER — PROPOFOL 10 MG/ML IV EMUL
INTRAVENOUS | Status: DC | PRN
  Administered 2011-07-12: 180 mg via INTRAVENOUS

## 2011-07-12 MED ORDER — HYDROMORPHONE 0.3 MG/ML IV SOLN
INTRAVENOUS | Status: DC
  Administered 2011-07-12: 0 via INTRAVENOUS
  Administered 2011-07-12: 0.799 mg via INTRAVENOUS
  Administered 2011-07-12: 1.3 mL via INTRAVENOUS
  Administered 2011-07-12: 15:00:00 via INTRAVENOUS
  Administered 2011-07-13 (×2): 0.3 mg via INTRAVENOUS

## 2011-07-12 MED ORDER — FENTANYL CITRATE 0.05 MG/ML IJ SOLN: 25.0000 ug | INTRAMUSCULAR | Status: DC | PRN

## 2011-07-12 MED ORDER — LIDOCAINE HCL (CARDIAC) 20 MG/ML IV SOLN
INTRAVENOUS | Status: DC | PRN
  Administered 2011-07-12: 50 mg via INTRAVENOUS

## 2011-07-12 MED ORDER — DEXTROSE IN LACTATED RINGERS 5 % IV SOLN
INTRAVENOUS | Status: DC
  Administered 2011-07-12 – 2011-07-13 (×2): via INTRAVENOUS

## 2011-07-12 MED ORDER — HYDROMORPHONE 0.3 MG/ML IV SOLN
INTRAVENOUS | Status: AC
  Filled 2011-07-12: qty 25

## 2011-07-12 MED ORDER — PANTOPRAZOLE SODIUM 40 MG PO TBEC
40.0000 mg | DELAYED_RELEASE_TABLET | Freq: Every day | ORAL | Status: DC
  Administered 2011-07-12: 40 mg via ORAL
  Filled 2011-07-12 (×2): qty 1

## 2011-07-12 MED ORDER — SODIUM CHLORIDE 0.9 % IJ SOLN
9.0000 mL | INTRAMUSCULAR | Status: DC | PRN
  Administered 2011-07-13: 3 mL via INTRAVENOUS

## 2011-07-12 MED ORDER — ZOLPIDEM TARTRATE 5 MG PO TABS: 5.0000 mg | ORAL_TABLET | Freq: Every evening | ORAL | Status: DC | PRN

## 2011-07-12 MED ORDER — LIDOCAINE HCL (CARDIAC) 20 MG/ML IV SOLN
INTRAVENOUS | Status: AC
  Filled 2011-07-12: qty 5

## 2011-07-12 MED ORDER — GLYCOPYRROLATE 0.2 MG/ML IJ SOLN
INTRAMUSCULAR | Status: DC | PRN
  Administered 2011-07-12: .4 mg via INTRAVENOUS

## 2011-07-12 MED ORDER — STERILE WATER FOR IRRIGATION IR SOLN
Status: DC | PRN
  Administered 2011-07-12: 09:00:00 via INTRAVESICAL

## 2011-07-12 MED ORDER — NEOSTIGMINE METHYLSULFATE 1 MG/ML IJ SOLN
INTRAMUSCULAR | Status: DC | PRN
  Administered 2011-07-12: 3 mg via INTRAMUSCULAR

## 2011-07-12 MED ORDER — DIPHENHYDRAMINE HCL 12.5 MG/5ML PO ELIX: 12.5000 mg | ORAL_SOLUTION | Freq: Four times a day (QID) | ORAL | Status: DC | PRN

## 2011-07-12 MED ORDER — FENTANYL CITRATE 0.05 MG/ML IJ SOLN
INTRAMUSCULAR | Status: DC | PRN
  Administered 2011-07-12 (×2): 100 ug via INTRAVENOUS
  Administered 2011-07-12: 50 ug via INTRAVENOUS

## 2011-07-12 MED ORDER — ALUM & MAG HYDROXIDE-SIMETH 200-200-20 MG/5ML PO SUSP: 30.0000 mL | ORAL | Status: DC | PRN

## 2011-07-12 MED ORDER — MENTHOL 3 MG MT LOZG: 1.0000 | LOZENGE | OROMUCOSAL | Status: DC | PRN

## 2011-07-12 MED ORDER — ONDANSETRON HCL 4 MG/2ML IJ SOLN
INTRAMUSCULAR | Status: DC | PRN
  Administered 2011-07-12: 4 mg via INTRAVENOUS

## 2011-07-12 MED ORDER — KETOROLAC TROMETHAMINE 30 MG/ML IJ SOLN
INTRAMUSCULAR | Status: AC
  Administered 2011-07-12: 30 mg via INTRAVENOUS
  Filled 2011-07-12: qty 1

## 2011-07-12 MED ORDER — BUPIVACAINE HCL (PF) 0.25 % IJ SOLN
INTRAMUSCULAR | Status: DC | PRN
  Administered 2011-07-12: 20 mL

## 2011-07-12 MED ORDER — LACTATED RINGERS IV SOLN
INTRAVENOUS | Status: DC
  Administered 2011-07-12 (×2): via INTRAVENOUS

## 2011-07-12 MED ORDER — DEXAMETHASONE SODIUM PHOSPHATE 10 MG/ML IJ SOLN
INTRAMUSCULAR | Status: AC
  Filled 2011-07-12: qty 1

## 2011-07-12 MED ORDER — MIDAZOLAM HCL 2 MG/2ML IJ SOLN
INTRAMUSCULAR | Status: AC
  Filled 2011-07-12: qty 2

## 2011-07-12 MED ORDER — IBUPROFEN 800 MG PO TABS: 800.0000 mg | ORAL_TABLET | Freq: Three times a day (TID) | ORAL | Status: DC | PRN

## 2011-07-12 MED ORDER — NALOXONE HCL 0.4 MG/ML IJ SOLN: 0.4000 mg | INTRAMUSCULAR | Status: DC | PRN

## 2011-07-12 MED ORDER — ACETAMINOPHEN 10 MG/ML IV SOLN
1000.0000 mg | Freq: Four times a day (QID) | INTRAVENOUS | Status: DC
  Administered 2011-07-12: 1000 mg via INTRAVENOUS
  Filled 2011-07-12 (×4): qty 100

## 2011-07-12 MED ORDER — PHENYLEPHRINE 40 MCG/ML (10ML) SYRINGE FOR IV PUSH (FOR BLOOD PRESSURE SUPPORT)
PREFILLED_SYRINGE | INTRAVENOUS | Status: AC
  Filled 2011-07-12: qty 5

## 2011-07-12 MED ORDER — HYDROMORPHONE HCL 1 MG/ML IJ SOLN
INTRAMUSCULAR | Status: AC
  Filled 2011-07-12: qty 1

## 2011-07-12 MED ORDER — GLYCOPYRROLATE 0.2 MG/ML IJ SOLN
INTRAMUSCULAR | Status: AC
  Filled 2011-07-12: qty 1

## 2011-07-12 MED ORDER — FENTANYL CITRATE 0.05 MG/ML IJ SOLN
INTRAMUSCULAR | Status: AC
  Filled 2011-07-12: qty 5

## 2011-07-12 MED ORDER — DEXAMETHASONE SODIUM PHOSPHATE 10 MG/ML IJ SOLN
INTRAMUSCULAR | Status: DC | PRN
  Administered 2011-07-12: 10 mg via INTRAVENOUS

## 2011-07-12 SURGICAL SUPPLY — 59 items
ADH SKN CLS APL DERMABOND .7 (GAUZE/BANDAGES/DRESSINGS) ×1
BAG URINE DRAINAGE (UROLOGICAL SUPPLIES) ×2 IMPLANT
BARRIER ADHS 3X4 INTERCEED (GAUZE/BANDAGES/DRESSINGS) ×2 IMPLANT
BLADELESS LONG 8MM (BLADE) ×2 IMPLANT
BRR ADH 4X3 ABS CNTRL BYND (GAUZE/BANDAGES/DRESSINGS) ×1
CABLE HIGH FREQUENCY MONO STRZ (ELECTRODE) ×2 IMPLANT
CATH FOLEY 3WAY  5CC 16FR (CATHETERS) ×1
CATH FOLEY 3WAY 5CC 16FR (CATHETERS) ×1 IMPLANT
CONT PATH 16OZ SNAP LID 3702 (MISCELLANEOUS) ×2 IMPLANT
COVER MAYO STAND STRL (DRAPES) ×2 IMPLANT
COVER TABLE BACK 60X90 (DRAPES) ×4 IMPLANT
COVER TIP SHEARS 8 DVNC (MISCELLANEOUS) ×1 IMPLANT
COVER TIP SHEARS 8MM DA VINCI (MISCELLANEOUS) ×1
DECANTER SPIKE VIAL GLASS SM (MISCELLANEOUS) ×1 IMPLANT
DERMABOND ADVANCED (GAUZE/BANDAGES/DRESSINGS) ×1
DERMABOND ADVANCED .7 DNX12 (GAUZE/BANDAGES/DRESSINGS) IMPLANT
DRAPE HUG U DISPOSABLE (DRAPE) ×2 IMPLANT
DRAPE LG THREE QUARTER DISP (DRAPES) ×4 IMPLANT
DRAPE MONITOR DA VINCI (DRAPE) ×2 IMPLANT
DRAPE WARM FLUID 44X44 (DRAPE) ×2 IMPLANT
ELECT REM PT RETURN 9FT ADLT (ELECTROSURGICAL) ×2
ELECTRODE REM PT RTRN 9FT ADLT (ELECTROSURGICAL) ×1 IMPLANT
EVACUATOR SMOKE 8.L (FILTER) ×2 IMPLANT
GAUZE VASELINE 3X9 (GAUZE/BANDAGES/DRESSINGS) IMPLANT
GLOVE BIO SURGEON STRL SZ 6.5 (GLOVE) ×4 IMPLANT
GLOVE BIOGEL PI IND STRL 7.0 (GLOVE) ×3 IMPLANT
GLOVE BIOGEL PI INDICATOR 7.0 (GLOVE) ×3
GOWN PREVENTION PLUS LG XLONG (DISPOSABLE) ×8 IMPLANT
KIT DISP ACCESSORY 4 ARM (KITS) ×2 IMPLANT
NEEDLE INSUFFLATION 14GA 120MM (NEEDLE) ×2 IMPLANT
NS IRRIG 1000ML POUR BTL (IV SOLUTION) ×6 IMPLANT
OCCLUDER COLPOPNEUMO (BALLOONS) IMPLANT
PACK LAVH (CUSTOM PROCEDURE TRAY) ×2 IMPLANT
PAD PREP 24X48 CUFFED NSTRL (MISCELLANEOUS) ×4 IMPLANT
PLUG CATH AND CAP STER (CATHETERS) ×2 IMPLANT
RUMI II 3.0CM BLUE KOH-EFFICIE (DISPOSABLE) ×2 IMPLANT
SCISSORS LAP 5X35 DISP (ENDOMECHANICALS) IMPLANT
SET IRRIG TUBING LAPAROSCOPIC (IRRIGATION / IRRIGATOR) ×2 IMPLANT
SOLUTION ELECTROLUBE (MISCELLANEOUS) ×2 IMPLANT
SPONGE LAP 18X18 X RAY DECT (DISPOSABLE) IMPLANT
SUT VIC AB 0 CT1 27 (SUTURE) ×12
SUT VIC AB 0 CT1 27XBRD ANTBC (SUTURE) ×5 IMPLANT
SUT VIC AB 4-0 PS2 18 (SUTURE) ×4 IMPLANT
SUT VICRYL 0 UR6 27IN ABS (SUTURE) ×2 IMPLANT
SYR 50ML LL SCALE MARK (SYRINGE) ×2 IMPLANT
SYSTEM CONVERTIBLE TROCAR (TROCAR) IMPLANT
TIP UTERINE 5.1X6CM LAV DISP (MISCELLANEOUS) IMPLANT
TIP UTERINE 6.7X10CM GRN DISP (MISCELLANEOUS) IMPLANT
TIP UTERINE 6.7X6CM WHT DISP (MISCELLANEOUS) ×2 IMPLANT
TIP UTERINE 6.7X8CM BLUE DISP (MISCELLANEOUS) IMPLANT
TOWEL OR 17X24 6PK STRL BLUE (TOWEL DISPOSABLE) ×6 IMPLANT
TRAY FOLEY BAG SILVER LF 14FR (CATHETERS) ×2 IMPLANT
TROCAR 12M 150ML BLUNT (TROCAR) IMPLANT
TROCAR DISP BLADELESS 8 DVNC (TROCAR) ×1 IMPLANT
TROCAR DISP BLADELESS 8MM (TROCAR) ×1
TROCAR Z-THREAD 12X150 (TROCAR) ×2 IMPLANT
TROCAR Z-THREAD BLADED 12X100M (TROCAR) ×2 IMPLANT
TUBING FILTER THERMOFLATOR (ELECTROSURGICAL) ×2 IMPLANT
WARMER LAPAROSCOPE (MISCELLANEOUS) ×2 IMPLANT

## 2011-07-12 NOTE — Anesthesia Procedure Notes (Addendum)
Procedure Name: Intubation Date/Time: 07/12/2011 7:41 AM Performed by: Cephus Shelling Pre-anesthesia Checklist: Patient identified, Emergency Drugs available, Patient being monitored and Suction available Patient Re-evaluated:Patient Re-evaluated prior to inductionOxygen Delivery Method: Circle System Utilized Preoxygenation: Pre-oxygenation with 100% oxygen Intubation Type: Combination inhalational/ intravenous induction and Circoid Pressure applied Ventilation: Mask ventilation without difficulty Laryngoscope Size: Mac and 3 Grade View: Grade III Tube type: Oral Tube size: 7.0 mm Number of attempts: 2 Airway Equipment and Method: stylet and bite block Placement Confirmation: positive ETCO2 and breath sounds checked- equal and bilateral Secured at: 20 cm Tube secured with: Tape Dental Injury: Teeth and Oropharynx as per pre-operative assessment  Difficulty Due To: Difficult Airway- due to anterior larynx Future Recommendations: Recommend- induction with short-acting agent, and alternative techniques readily available Comments: Pt positioned in sniffing position with roll under shoulders

## 2011-07-12 NOTE — Progress Notes (Signed)
Subjective: Patient reports tolerating PO.  Reviewed intraop findings  Objective: I have reviewed patient's vital signs.  vital signs. Filed Vitals:   07/12/11 1800  BP:   Pulse:   Temp:   Resp: 16     Total I/O In: 2220 [P.O.:120; I.V.:2100] Out: 1585 [Urine:1485; Blood:100]  Lab Results  Component Value Date   WBC 5.6 07/05/2011   HGB 10.0* 07/05/2011   HCT 33.0* 07/05/2011   MCV 77.8* 07/05/2011   PLT 341 07/05/2011   Lab Results  Component Value Date   CREATININE 0.49* 07/12/2011    EXAM General: alert, cooperative and no distress Resp: clear to auscultation bilaterally Cardio: regular rate and rhythm, S1, S2 normal, no murmur, click, rub or gallop GI: soft, non-tender; bowel sounds normal; no masses,  no organomegaly Extremities: no edema, redness or tenderness in the calves or thighs Vaginal Bleeding: none  Assessment: s/p Procedure(s): ROBOTIC ASSISTED TOTAL HYSTERECTOMY: stable  Plan: Encourage ambulation  LOS: 0 days    Rhilyn Battle A, MD 07/12/2011 6:49 PM    07/12/2011, 6:49 PM

## 2011-07-12 NOTE — Anesthesia Preprocedure Evaluation (Addendum)
Anesthesia Evaluation Anesthesia Physical Anesthesia Plan Anesthesia Quick Evaluation  

## 2011-07-12 NOTE — Transfer of Care (Signed)
Immediate Anesthesia Transfer of Care Note  Patient: [name]  Procedure(s) Performed:  ROBOTIC ASSISTED TOTAL HYSTERECTOMY - Requests 3hrs.  Patient Location: PACU  Anesthesia Type: General  Level of Consciousness: awake, alert , sedated and patient cooperative  Airway & Oxygen Therapy: Patient Spontanous Breathing and Patient connected to nasal cannula oxygen  Post-op Assessment: Report given to PACU RN and Post -op Vital signs reviewed and stable  Post vital signs: Reviewed and stable  Complications: No apparent anesthesia complications

## 2011-07-12 NOTE — Anesthesia Postprocedure Evaluation (Signed)
  Anesthesia Post-op Note  Patient: [name]  Procedure(s) Performed:  ROBOTIC ASSISTED TOTAL HYSTERECTOMY - Requests 3hrs.  Patient Location: PACU  Anesthesia Type: General  Level of Consciousness: awake, alert  and oriented  Airway and Oxygen Therapy: Patient Spontanous Breathing  Post-op Pain: none  Post-op Assessment: Post-op Vital signs reviewed, Patient's Cardiovascular Status Stable, Respiratory Function Stable, Patent Airway, No signs of Nausea or vomiting and Pain level controlled  Post-op Vital Signs: Reviewed and stable  Complications: No apparent anesthesia complications

## 2011-07-12 NOTE — Brief Op Note (Signed)
07/12/2011  12:08 PM  PATIENT:  [name]  40 y.o. female  PRE-OPERATIVE DIAGNOSIS:  Menorrhagia, Dysmenorrhea, Fibroid  POST-OPERATIVE DIAGNOSIS:  Menorrhagia, Dysmenorrhea, Fibroid  PROCEDURE:  Procedure(s): DAVINCI ROBOTIC ASSISTED TOTAL HYSTERECTOMY  SURGEON:  Surgeon(s): Serita Kyle, MD Alfredia Ferguson Mody  PHYSICIAN ASSISTANT:   ASSISTANTS: Shea Evans, MD ANESTHESIA:   general  OR FLUID I/O:  Total I/O In: 1700 [I.V.:1700] Out: 435 [Urine:335; Blood:100]  BLOOD ADMINISTERED:none  FINDINGS FIBROID UTERUS, NL APPENDIX. NL LIVER EDGE, NL OVARIES, SOME BLADDER ADHESIONS. 342 GM UTERUS DRAINS: none   LOCAL MEDICATIONS USED:  MARCAINE 7CC  SPECIMEN:  Source of Specimen:  uterus withcervix  DISPOSITION OF SPECIMEN:  PATHOLOGY  COUNTS:  YES  TOURNIQUET:  * No tourniquets in log *  DICTATION: .Other Dictation: Dictation Number   PLAN OF CARE: Admit for overnight observation  PATIENT DISPOSITION:  PACU - hemodynamically stable.   Delay start of Pharmacological VTE agent (>24hrs) due to surgical blood loss or risk of bleeding:  no

## 2011-07-12 NOTE — Anesthesia Postprocedure Evaluation (Addendum)
Anesthesia Post Note  Patient: [name]  Procedure(s) Performed:  ROBOTIC ASSISTED TOTAL HYSTERECTOMY - Requests 3hrs.  Anesthesia type: General  Patient location: PACU  Post pain: Pain level controlled  Post assessment: Post-op Vital signs reviewed  Last Vitals:  Filed Vitals:   07/12/11 1530  BP: 123/80  Pulse: 86  Temp: 98.3 F (36.8 C)  Resp: 16    Post vital signs: Reviewed  Level of consciousness: sedated  Complications: No apparent anesthesia complicationsfj

## 2011-07-13 LAB — CBC
HCT: 27.6 % — ABNORMAL LOW (ref 36.0–46.0)
MCH: 23.7 pg — ABNORMAL LOW (ref 26.0–34.0)
MCV: 78 fL (ref 78.0–100.0)
Platelets: 325 10*3/uL (ref 150–400)
RBC: 3.54 MIL/uL — ABNORMAL LOW (ref 3.87–5.11)

## 2011-07-13 MED ORDER — OXYCODONE-ACETAMINOPHEN 5-325 MG PO TABS: 1.0000 | ORAL_TABLET | ORAL | Status: DC | PRN

## 2011-07-13 MED ORDER — IBUPROFEN 200 MG PO TABS: 800.0000 mg | ORAL_TABLET | Freq: Four times a day (QID) | ORAL | Status: AC | PRN

## 2011-07-13 MED ORDER — HYDROCODONE-ACETAMINOPHEN 5-500 MG PO TABS: 1.0000 | ORAL_TABLET | ORAL | Status: DC | PRN

## 2011-07-13 MED ORDER — OXYCODONE-ACETAMINOPHEN 5-325 MG PO TABS: 1.0000 | ORAL_TABLET | ORAL | Status: AC | PRN

## 2011-07-13 NOTE — Progress Notes (Signed)
D/c instructions reviewed with pt. And mother, both state understanding of same, no home equipment needed, ambulated to car with staff without incident, d/c'd home with mother.

## 2011-07-13 NOTE — Progress Notes (Signed)
1 Day Post-Op Procedure(s): ROBOTIC ASSISTED TOTAL HYSTERECTOMY  Subjective: Patient reports tolerating PO, + flatus and no problems voiding.    Objective: I have reviewed patient's vital signs, intake and output and labs. T mac  General: alert, cooperative and no distress Resp: clear to auscultation bilaterally Cardio: regular rate and rhythm GI: soft, non-tender; bowel sounds normal; no masses,  no organomegaly and incision: clean, dry and intact Extremities: no edema, redness or tenderness in the calves or thighs Vaginal Bleeding: none intact incisions  Assessment: s/p Procedure(s): ROBOTIC ASSISTED TOTAL HYSTERECTOMY: stable, progressing well and tolerating diet  Plan: Discharge home doing well. ready for home  LOS: 1 day    [place] Soy A 07/13/2011, 8:41 AM

## 2011-07-13 NOTE — Anesthesia Postprocedure Evaluation (Signed)
  Anesthesia Post-op Note  Patient: [name]  Procedure(s) Performed:  ROBOTIC ASSISTED TOTAL HYSTERECTOMY - Requests 3hrs.  Patient Location: [facility]  Anesthesia Type: General  Level of Consciousness: awake, alert  and oriented  Airway and Oxygen Therapy: Patient Spontanous Breathing  Post-op Pain: none  Post-op Assessment: Post-op Vital signs reviewed and Patient's Cardiovascular Status Stable  Post-op Vital Signs: Reviewed and stable  Complications: No apparent anesthesia complications

## 2011-07-13 NOTE — Op Note (Signed)
[name], [name]          ACCOUNT NO.:  [id number]  MEDICAL RECORD NO.:  [id number]  LOCATION:  9310                          FACILITY:  [facility]  PHYSICIAN:  Maxie Better, M.D.DATE OF BIRTH:  Jan 16, 1971  DATE OF PROCEDURE:  07/12/2011 DATE OF DISCHARGE:                              OPERATIVE REPORT   PREOPERATIVE DIAGNOSES:  Menorrhagia, dysmenorrhea, and fibroid uterus.  POSTOPERATIVE DIAGNOSES:  Menorrhagia, fibroid uterus, and dysmenorrhea.  PROCEDURE:  DaVinci robotic total hysterectomy.  ANESTHESIA:  General.  SURGEON.:  Maxie Better, MD  ASSISTANT:  Darryl Nestle, MD  PROCEDURE:  Under adequate general anesthesia, the patient is placed in the dorsal lithotomy position specifically for robotic surgery. Examination under anesthesia revealed an irregular 10-week size uterus. No adnexal masses could be appreciated.  The patient was sterilely prepped indwelling Foley catheter was in place.  Weighted speculum was placed in the vagina.  The patient was draped in the usual sterile fashion.  A Sims retractor was used anteriorly.  The anterior lip of the cervix was grasped with a single-tooth tenaculum, 0 Vicryl figure-of- eight sutures was placed in the anterior-posterior lip of the cervix. Cervix was then serially dilated up to #25 [employer] dilator.  Uterus sounded to 7 cm.  The cervix measured about 3 cm.  The #6 RUMI KOH ring and the intracervical manipulator was inserted into the uterus without incident.  The balloon was insufflated.  The cup was then fitted around the cervix.  The weighted speculum and retractor was then removed. Attention was then turned to the abdomen.  The patient was noted to have a small umbilical hernia.  About an inch above the umbilicus, marcaine 0.25% was injected in a vertical fashion.  A vertical incision was then made.  Veress needle was introduced and tested.  Opening pressure of 6 was noted.  4 liters of CO2 was insufflated.   The Veress needle was then removed.  A 12-mm disposable trocar was introduced into the abdomen without incident.  The robotic camera was introduced through that port. Panoramic inspection was then performed, uterus with multiple fibroids was noted.  Ovaries were noted and they were closely attached to the uterus.  Two 8 mm robotic ports were placed on the left.  An 8-mm port was placed on the right and an assistant port at 12 mm at the right lower quadrant. Under direct visualization, the robotic trocars were placed two on the left, one on the right and an assistant port.  Once there were placed, pelvis was further inspected, no adnexal masses was noted, shortened left utero-ovarian ligament was noted  And a nonexistent right utero-ovarian ligament was noted.  The three-way Foley had been placed due to the patient's prior history of cesarean section x2.  The robot was left- sided docked to these instruments and PK,  Prograsper and the monopolar scissors were then placed in the abdomen under direct visualization.  At that point, I went to the surgical console.  At the surgical console, the third arm was then utilized to place the uterus on the patient's right.  Ureter on the left was identified peristalsing, the left utero-ovarian ligament was serially clamped, cauterized, and then cut.  The left round ligament  was then subsequently clamped, cauterized, and cut, carried down to the uterine vessels.  The anterior leaf of the broad ligaments and opened and carried anteriorly.  The uterine vessels were skeletonized and not cauterized.  The uterus was then placed in the posterior position in order to further assess the vesicouterine peritoneum which was adherent at that point, the bladder was filled up to 175 mL of fluid.  Retrograde and sharp dissection was then utilized to take the bladder off the lower uterine segment and from the scar tissue.  Once this was done, attention was turned to the  contralateral side and there on the right due to the ovary being adherent to the right side of the uterus, careful dissection resulted in the ovaries being detached from the uterus.  The right ureter being noted to be peristalsing.  The round ligament on the right was subsequently clamped, cauterized, and cut.  The uterine vessels was then clearly visualized.  The anterior and posterior broad ligaments were opened. The uterine vessels were skeletonized.  Uterine vessels were serially clamped, cauterized but not cut.  Attention was then turned back to the contralateral sides.  The uterine vessels at that point was cauterized. The bladder flap was further developed.  Once this was done, the uterine vessel on the left was cut.  Posteriorly the cervix was detached from its vaginal attachment, carried around to the left side partially and on the right the uterine vessels were then clamped, cauterized, and cut, until the circumferential detachment of the cervix to the vagina was then performed at which time the specimen was then removed and placed this placed upwardly the manipulator was removed.  The vaginal occluder had been insufflated thereafter.  The vaginal cuff had some bleeding particularly in the posterior aspect which was subsequently cauterized. The vaginal cuff was closed with interrupted figure-of-eight sutures. Good hemostasis was then noted at that point, the robot was undocked. The right assistant port was then utilized for the Storz morcellator to be placed, and the specimen was then morcellated.  Pieces removed. Abdomen was irrigated, adnexa was then noted to be way above the field and with good hemostasis.  The specimen was the morcellated uterus and cervix  weighing 342 g. Abdomen was copiously irrigated.  The upper abdomen fluid was removed. With good hemostasis noted, all the ports were then subsequently removed.  The abdomen deflated.  The 12-mm port sites were closed  with closed with fascial stitch of 0 Vicryl figure-of-eight sutures, and the skin approximated, subcuticular stitches and Dermabond.  Specimen was the uterus with cervix( morcellated) sent to Pathology.  Estimated blood loss was 100 mL.  Intraoperative fluid 1700 mL, urine output 355 mL clear yellow urine.  Sponge and instrument counts x2 was correct. Complications none.  The patient tolerated procedure well and was transferred to recovery in stable condition.     Maxie Better, M.D.     [place]/MEDQ  D:  07/12/2011  T:  07/13/2011  Job:  [id number]

## 2011-07-13 NOTE — Addendum Note (Signed)
Addendum  created 07/13/11 1336 by Madison Hickman   Modules edited:Notes Section

## 2011-07-13 NOTE — Discharge Summary (Signed)
Physician Discharge Summary  Patient ID: [name] MRN: [id number] DOB/AGE: 1971/04/27 40 y.o.  Admit date: 07/12/2011 Discharge date: 07/13/2011  Admission Diagnoses: menorrhagia, dysmenorrhea, uterine fibroids  Discharge Diagnoses: menorrhagia, dysmenorrhea, uterine fibroids Principal Problem:  *Uterine fibroid Active Problems:  Menorrhagia  Dysmenorrhea  Status post total hysterectomy Procedure: Davinci robotic TLH  Discharged Condition: stable Hospital Course: Pt underwent davinci robotic TLH 9/25, postop course uncomplicated  Consults: none  Significant Diagnostic Studies: labs: hgb 8.4 hct 27.6 plt 325 wbc 8.2  Treatments: surgery: see above  Discharge Exam: Blood pressure 108/60, pulse 92, temperature 99.3 F (37.4 C), temperature source Oral, resp. rate 18, height 5\' 5"  (1.651 m), weight 208 lb (94.348 kg), SpO2 97.00%. General appearance: alert, cooperative and no distress Resp: clear to auscultation bilaterally Cardio: regular rate and rhythm, S1, S2 normal, no murmur, click, rub or gallop GI: soft, non-tender; bowel sounds normal; no masses,  no organomegaly Extremities: no edema, redness or tenderness in the calves or thighs Incision/Wound: no erythema/induration or exudate  Disposition:  home  Discharge Orders    Future Orders Please Complete By Expires   Diet general      May walk up steps      May shower / Bathe      No dressing needed      Discharge wound care:      Comments:   For dermabond   Discharge instructions      Comments:   Call if temperature greater than equal to 100.4, nothing per vagina for 4-6 weeks or severe nausea vomiting, increased incisional pain , drainage or redness in the incision site, no straining with bowel movements, showers no bath no lifting > 25 lb for 2 wk, no driving for 2 week   Discharge patient        Current Discharge Medication List    START taking these medications   Details  ibuprofen (ADVIL) 200  MG tablet Take 4 tablets (800 mg total) by mouth every 6 (six) hours as needed for pain. Qty: 30 tablet, Refills: 0      STOP taking these medications     HYDROcodone-acetaminophen (VICODIN) 5-500 MG per tablet          Signed: Wladyslaw Henrichs A 07/13/2011, 8:52 AM

## 2011-07-13 NOTE — Progress Notes (Signed)
Encounter addended by: Madison Hickman on: 07/13/2011  1:36 PM<BR>     Documentation filed: Notes Section

## 2011-07-28 LAB — URINALYSIS, ROUTINE W REFLEX MICROSCOPIC
Bilirubin Urine: NEGATIVE
Glucose, UA: NEGATIVE
Hgb urine dipstick: NEGATIVE
Ketones, ur: NEGATIVE
Nitrite: NEGATIVE
Specific Gravity, Urine: 1.018
pH: 6.5

## 2011-09-21 ENCOUNTER — Ambulatory Visit (INDEPENDENT_AMBULATORY_CARE_PROVIDER_SITE_OTHER): Payer: BC Managed Care – PPO

## 2011-09-21 DIAGNOSIS — J309 Allergic rhinitis, unspecified: Secondary | ICD-10-CM

## 2011-09-21 DIAGNOSIS — H8110 Benign paroxysmal vertigo, unspecified ear: Secondary | ICD-10-CM

## 2011-11-10 ENCOUNTER — Other Ambulatory Visit: Payer: Self-pay | Admitting: Obstetrics and Gynecology

## 2011-11-10 DIAGNOSIS — N644 Mastodynia: Secondary | ICD-10-CM

## 2011-11-17 ENCOUNTER — Ambulatory Visit
Admission: RE | Admit: 2011-11-17 | Discharge: 2011-11-17 | Disposition: A | Discharge status: Home / Self Care | Payer: BC Managed Care – PPO | Source: Ambulatory Visit | Attending: Obstetrics and Gynecology | Admitting: Obstetrics and Gynecology

## 2011-11-17 DIAGNOSIS — N644 Mastodynia: Secondary | ICD-10-CM

## 2012-02-29 ENCOUNTER — Ambulatory Visit (INDEPENDENT_AMBULATORY_CARE_PROVIDER_SITE_OTHER): Payer: BC Managed Care – PPO | Admitting: Family Medicine

## 2012-02-29 VITALS — BP 110/74 | HR 94 | Temp 99.0°F | Resp 16 | Ht 65.0 in | Wt 212.8 lb

## 2012-02-29 DIAGNOSIS — M25531 Pain in right wrist: Secondary | ICD-10-CM

## 2012-02-29 DIAGNOSIS — M79609 Pain in unspecified limb: Secondary | ICD-10-CM

## 2012-02-29 MED ORDER — DICLOFENAC SODIUM 1 % TD GEL: 1.0000 "application " | Freq: Four times a day (QID) | TRANSDERMAL | Status: DC

## 2012-02-29 NOTE — Progress Notes (Signed)
41 year old woman who comes in with acute right wrist injury. She states that the child put a car seat yesterday and felt a pop with subsequent pain in the ulnar region of her regular is. She is able to dorsi and plantar flex her wrist but has pain when she tries to abductor wrist or tried to turn a key in a LOC.  Objective: Right wrist inspection normal with no ecchymosis or swelling. There is no bony tenderness she does have a little bit of tenderness between the ulnar styloid and the pisiform. She has full range of motion.  Assessment: I really believe this patient has a wrist sprain and needs to immobilize it for approximately 5-10 days. There is no evidence for any fracture.  Plan: Diclofenac 1% gel 4 times a day to the affected area, use splint. If pain persists at least 48 hours unchanged then she should come back and get an x-ray

## 2012-05-16 ENCOUNTER — Ambulatory Visit: Payer: BC Managed Care – PPO

## 2012-07-02 ENCOUNTER — Other Ambulatory Visit: Payer: Self-pay | Admitting: Obstetrics and Gynecology

## 2012-07-02 DIAGNOSIS — R599 Enlarged lymph nodes, unspecified: Secondary | ICD-10-CM

## 2012-07-06 ENCOUNTER — Ambulatory Visit
Admission: RE | Admit: 2012-07-06 | Discharge: 2012-07-06 | Disposition: A | Discharge status: Home / Self Care | Payer: BC Managed Care – PPO | Source: Ambulatory Visit | Attending: Obstetrics and Gynecology | Admitting: Obstetrics and Gynecology

## 2012-07-06 DIAGNOSIS — R599 Enlarged lymph nodes, unspecified: Secondary | ICD-10-CM

## 2012-09-17 ENCOUNTER — Ambulatory Visit: Payer: BC Managed Care – PPO

## 2012-09-17 ENCOUNTER — Encounter: Payer: Self-pay | Admitting: Family Medicine

## 2012-09-17 ENCOUNTER — Ambulatory Visit (INDEPENDENT_AMBULATORY_CARE_PROVIDER_SITE_OTHER): Payer: BC Managed Care – PPO | Admitting: Family Medicine

## 2012-09-17 VITALS — BP 120/80 | HR 84 | Temp 98.6°F | Resp 16 | Ht 65.5 in | Wt 212.6 lb

## 2012-09-17 DIAGNOSIS — R079 Chest pain, unspecified: Secondary | ICD-10-CM

## 2012-09-17 DIAGNOSIS — Z0000 Encounter for general adult medical examination without abnormal findings: Secondary | ICD-10-CM

## 2012-09-17 LAB — CBC WITH DIFFERENTIAL/PLATELET
Basophils Absolute: 0.1 10*3/uL (ref 0.0–0.1)
Basophils Relative: 1 % (ref 0–1)
Eosinophils Absolute: 0.2 10*3/uL (ref 0.0–0.7)
Eosinophils Relative: 4 % (ref 0–5)
HCT: 36 % (ref 36.0–46.0)
Hemoglobin: 11.8 g/dL — ABNORMAL LOW (ref 12.0–15.0)
Lymphocytes Relative: 33 % (ref 12–46)
Lymphs Abs: 2 10*3/uL (ref 0.7–4.0)
MCH: 26.5 pg (ref 26.0–34.0)
MCHC: 32.8 g/dL (ref 30.0–36.0)
MCV: 80.7 fL (ref 78.0–100.0)
Monocytes Absolute: 0.6 10*3/uL (ref 0.1–1.0)
Monocytes Relative: 9 % (ref 3–12)
Neutro Abs: 3.1 10*3/uL (ref 1.7–7.7)
Neutrophils Relative %: 53 % (ref 43–77)
Platelets: 372 10*3/uL (ref 150–400)
RBC: 4.46 MIL/uL (ref 3.87–5.11)
RDW: 13.9 % (ref 11.5–15.5)
WBC: 5.9 10*3/uL (ref 4.0–10.5)

## 2012-09-17 LAB — POCT UA - MICROSCOPIC ONLY
Casts, Ur, LPF, POC: NEGATIVE
Crystals, Ur, HPF, POC: NEGATIVE
Yeast, UA: NEGATIVE

## 2012-09-17 LAB — POCT URINALYSIS DIPSTICK
Bilirubin, UA: NEGATIVE
Glucose, UA: NEGATIVE
Ketones, UA: NEGATIVE
Leukocytes, UA: NEGATIVE
Nitrite, UA: NEGATIVE
Protein, UA: NEGATIVE
Spec Grav, UA: 1.02
Urobilinogen, UA: 0.2
pH, UA: 6.5

## 2012-09-17 NOTE — Progress Notes (Signed)
  Subjective:    Patient ID: [name], female    DOB: [birth year], 41 y.o.   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   Her only complaint today is some chest discomfort which she has had for a couple weeks.  She tried Ibuprofen 800 tid for three days which alleviated the pain but it came right back when she discontinued the ibuprofen.  She has a positive family history of angina (mother), CAD (grandmother died at age99).  Her father died with prostate ca.   Review of Systems  Constitutional: Negative.   HENT: Negative.   Eyes: Negative.   Respiratory: Negative.   Cardiovascular: Negative.   Gastrointestinal: Negative.   Genitourinary: Negative.   Musculoskeletal: Negative.   Neurological: Negative.   Hematological: Negative.   Psychiatric/Behavioral: Positive for agitation.       Objective:   Physical Exam General appearance:  Happy, articulate, alert, NAD Head:  [place] TM's and canals:  Clean and normal Oroph:  Good dental, no lesions Fundi:  Normal.  EOMI, PERLA Neck:  Mild lower neck soft tissue increase, supple, no adenopathy Chest:  Clear to auscultation.  Tender parasternal ribs 3-6 bilaterally Abdomen:  Soft, nontender, no HSM, no masses Extremities: normal pulses in extremities, no edema, normal appearance Skin:  No suspicious lesions  EKG:  [facility] reading (PRIMARY) by  Dr. Milus Glazier:  Irregular left hilum, probably normal.  2    Assessment & Plan:  Costochondritis, presumed.  Awaiting radiology overread  Ibuprofen prn Annual physical 1. Routine general medical examination at a health care facility  POCT UA - Microscopic Only, POCT urinalysis dipstick, TSH, CBC with Differential, Lipid panel, Comprehensive metabolic panel, EKG 12-Lead, DG Chest 2 View  2. Chest pain

## 2012-09-17 NOTE — Patient Instructions (Addendum)
Health Maintenance, Females A healthy lifestyle and preventative care can promote health and wellness.  Maintain regular health, dental, and eye exams.  Eat a healthy diet. Foods like vegetables, fruits, whole grains, low-fat dairy products, and lean protein foods contain the nutrients you need without too many calories. Decrease your intake of foods high in solid fats, added sugars, and salt. Get information about a proper diet from your caregiver, if necessary.  Regular physical exercise is one of the most important things you can do for your health. Most adults should get at least 150 minutes of moderate-intensity exercise (any activity that increases your heart rate and causes you to sweat) each week. In addition, most adults need muscle-strengthening exercises on 2 or more days a week.   Maintain a healthy weight. The body mass index (BMI) is a screening tool to identify possible weight problems. It provides an estimate of body fat based on height and weight. Your caregiver can help determine your BMI, and can help you achieve or maintain a healthy weight. For adults 20 years and older:  A BMI below 18.5 is considered underweight.  A BMI of 18.5 to 24.9 is normal.  A BMI of 25 to 29.9 is considered overweight.  A BMI of 30 and above is considered obese.  Maintain normal blood lipids and cholesterol by exercising and minimizing your intake of saturated fat. Eat a balanced diet with plenty of fruits and vegetables. Blood tests for lipids and cholesterol should begin at age 20 and be repeated every 5 years. If your lipid or cholesterol levels are high, you are over 50, or you are a high risk for heart disease, you may need your cholesterol levels checked more frequently.Ongoing high lipid and cholesterol levels should be treated with medicines if diet and exercise are not effective.  If you smoke, find out from your caregiver how to quit. If you do not use tobacco, do not start.  If you  are pregnant, do not drink alcohol. If you are breastfeeding, be very cautious about drinking alcohol. If you are not pregnant and choose to drink alcohol, do not exceed 1 drink per day. One drink is considered to be 12 ounces (355 mL) of beer, 5 ounces (148 mL) of wine, or 1.5 ounces (44 mL) of liquor.  Avoid use of street drugs. Do not share needles with anyone. Ask for help if you need support or instructions about stopping the use of drugs.  High blood pressure causes heart disease and increases the risk of stroke. Blood pressure should be checked at least every 1 to 2 years. Ongoing high blood pressure should be treated with medicines, if weight loss and exercise are not effective.  If you are 55 to 42 years old, ask your caregiver if you should take aspirin to prevent strokes.  Diabetes screening involves taking a blood sample to check your fasting blood sugar level. This should be done once every 3 years, after age 45, if you are within normal weight and without risk factors for diabetes. Testing should be considered at a younger age or be carried out more frequently if you are overweight and have at least 1 risk factor for diabetes.  Breast cancer screening is essential preventative care for women. You should practice "breast self-awareness." This means understanding the normal appearance and feel of your breasts and may include breast self-examination. Any changes detected, no matter how small, should be reported to a caregiver. Women in their 20s and 30s should have   a clinical breast exam (CBE) by a caregiver as part of a regular health exam every 1 to 3 years. After age 40, women should have a CBE every year. Starting at age 40, women should consider having a mammogram (breast X-ray) every year. Women who have a family history of breast cancer should talk to their caregiver about genetic screening. Women at a high risk of breast cancer should talk to their caregiver about having an MRI and a  mammogram every year.  The Pap test is a screening test for cervical cancer. Women should have a Pap test starting at age 21. Between ages 21 and 29, Pap tests should be repeated every 2 years. Beginning at age 30, you should have a Pap test every 3 years as long as the past 3 Pap tests have been normal. If you had a hysterectomy for a problem that was not cancer or a condition that could lead to cancer, then you no longer need Pap tests. If you are between ages 65 and 70, and you have had normal Pap tests going back 10 years, you no longer need Pap tests. If you have had past treatment for cervical cancer or a condition that could lead to cancer, you need Pap tests and screening for cancer for at least 20 years after your treatment. If Pap tests have been discontinued, risk factors (such as a new sexual partner) need to be reassessed to determine if screening should be resumed. Some women have medical problems that increase the chance of getting cervical cancer. In these cases, your caregiver may recommend more frequent screening and Pap tests.  The human papillomavirus (HPV) test is an additional test that may be used for cervical cancer screening. The HPV test looks for the virus that can cause the cell changes on the cervix. The cells collected during the Pap test can be tested for HPV. The HPV test could be used to screen women aged 30 years and older, and should be used in women of any age who have unclear Pap test results. After the age of 30, women should have HPV testing at the same frequency as a Pap test.  Colorectal cancer can be detected and often prevented. Most routine colorectal cancer screening begins at the age of 50 and continues through age 75. However, your caregiver may recommend screening at an earlier age if you have risk factors for colon cancer. On a yearly basis, your caregiver may provide home test kits to check for hidden blood in the stool. Use of a small camera at the end of a  tube, to directly examine the colon (sigmoidoscopy or colonoscopy), can detect the earliest forms of colorectal cancer. Talk to your caregiver about this at age 50, when routine screening begins. Direct examination of the colon should be repeated every 5 to 10 years through age 75, unless early forms of pre-cancerous polyps or small growths are found.  Hepatitis C blood testing is recommended for all people born from 1945 through 1965 and any individual with known risks for hepatitis C.  Practice safe sex. Use condoms and avoid high-risk sexual practices to reduce the spread of sexually transmitted infections (STIs). Sexually active women aged 25 and younger should be checked for Chlamydia, which is a common sexually transmitted infection. Older women with new or multiple partners should also be tested for Chlamydia. Testing for other STIs is recommended if you are sexually active and at increased risk.  Osteoporosis is a disease in which the   bones lose minerals and strength with aging. This can result in serious bone fractures. The risk of osteoporosis can be identified using a bone density scan. Women ages 66 and over and women at risk for fractures or osteoporosis should discuss screening with their caregivers. Ask your caregiver whether you should be taking a calcium supplement or vitamin D to reduce the rate of osteoporosis.  Menopause can be associated with physical symptoms and risks. Hormone replacement therapy is available to decrease symptoms and risks. You should talk to your caregiver about whether hormone replacement therapy is right for you.  Use sunscreen with a sun protection factor (SPF) of 30 or greater. Apply sunscreen liberally and repeatedly throughout the day. You should seek shade when your shadow is shorter than you. Protect yourself by wearing long sleeves, pants, a wide-brimmed hat, and sunglasses year round, whenever you are outdoors.  Notify your caregiver of new moles or  changes in moles, especially if there is a change in shape or color. Also notify your caregiver if a mole is larger than the size of a pencil eraser.  Stay current with your immunizations. Document Released: 04/18/2011 Document Revised: 12/26/2011 Document Reviewed: 04/18/2011 [facility] Patient Information 2013 [place], [place]. Exercise to Lose Weight Exercise and a healthy diet may help you lose weight. Your doctor may suggest specific exercises. EXERCISE IDEAS AND TIPS  Choose low-cost things you enjoy doing, such as walking, bicycling, or exercising to workout videos.  Take stairs instead of the elevator.  Walk during your lunch break.  Park your car further away from work or school.  Go to a gym or an exercise class.  Start with 5 to 10 minutes of exercise each day. Build up to 30 minutes of exercise 4 to 6 days a week.  Wear shoes with good support and comfortable clothes.  Stretch before and after working out.  Work out until you breathe harder and your heart beats faster.  Drink extra water when you exercise.  Do not do so much that you hurt yourself, feel dizzy, or get very short of breath. Exercises that burn about 150 calories:  Running 1  miles in 15 minutes.  Playing volleyball for 45 to 60 minutes.  Washing and waxing a car for 45 to 60 minutes.  Playing touch football for 45 minutes.  Walking 1  miles in 35 minutes.  Pushing a stroller 1  miles in 30 minutes.  Playing basketball for 30 minutes.  Raking leaves for 30 minutes.  Bicycling 5 miles in 30 minutes.  Walking 2 miles in 30 minutes.  Dancing for 30 minutes.  Shoveling snow for 15 minutes.  Swimming laps for 20 minutes.  Walking up stairs for 15 minutes.  Bicycling 4 miles in 15 minutes.  Gardening for 30 to 45 minutes.  Jumping rope for 15 minutes.  Washing windows or floors for 45 to 60 minutes. Document Released: 11/05/2010 Document Revised: 12/26/2011 Document Reviewed:  11/05/2010 [facility] Patient Information 2013 [place], [place].

## 2012-09-18 LAB — COMPREHENSIVE METABOLIC PANEL
ALT: 18 U/L (ref 0–35)
AST: 19 U/L (ref 0–37)
Albumin: 4.4 g/dL (ref 3.5–5.2)
Alkaline Phosphatase: 57 U/L (ref 39–117)
BUN: 15 mg/dL (ref 6–23)
CO2: 26 mEq/L (ref 19–32)
Calcium: 8.9 mg/dL (ref 8.4–10.5)
Chloride: 105 mEq/L (ref 96–112)
Creat: 0.72 mg/dL (ref 0.50–1.10)
Glucose, Bld: 99 mg/dL (ref 70–99)
Potassium: 4.1 mEq/L (ref 3.5–5.3)
Sodium: 142 mEq/L (ref 135–145)
Total Bilirubin: 0.4 mg/dL (ref 0.3–1.2)
Total Protein: 7.3 g/dL (ref 6.0–8.3)

## 2012-09-18 LAB — LIPID PANEL
Cholesterol: 169 mg/dL (ref 0–200)
HDL: 46 mg/dL (ref >39)
LDL Cholesterol: 106 mg/dL — ABNORMAL HIGH (ref 0–99)
Total CHOL/HDL Ratio: 3.7 Ratio
Triglycerides: 86 mg/dL (ref <150)
VLDL: 17 mg/dL (ref 0–40)

## 2012-09-18 LAB — TSH: TSH: 1.829 u[IU]/mL (ref 0.350–4.500)

## 2012-09-22 ENCOUNTER — Other Ambulatory Visit: Payer: Self-pay | Admitting: Family Medicine

## 2012-09-22 ENCOUNTER — Telehealth: Payer: Self-pay

## 2012-09-22 DIAGNOSIS — R079 Chest pain, unspecified: Secondary | ICD-10-CM

## 2012-09-22 MED ORDER — METHYLPREDNISOLONE 4 MG PO TABS: 4.0000 mg | ORAL_TABLET | Freq: Two times a day (BID) | ORAL | Status: DC

## 2012-09-22 NOTE — Telephone Encounter (Signed)
Pt states she saw dr Milus Glazier last night and was talking with him about a medication to take for chest pain, and scarring and she believes she would like to try it   Best number is [phone]

## 2012-10-31 ENCOUNTER — Ambulatory Visit
Admission: RE | Admit: 2012-10-31 | Discharge: 2012-10-31 | Disposition: A | Discharge status: Home / Self Care | Payer: BC Managed Care – PPO | Source: Ambulatory Visit | Attending: Family Medicine | Admitting: Family Medicine

## 2012-10-31 ENCOUNTER — Ambulatory Visit: Payer: BC Managed Care – PPO

## 2012-10-31 ENCOUNTER — Ambulatory Visit (INDEPENDENT_AMBULATORY_CARE_PROVIDER_SITE_OTHER): Payer: BC Managed Care – PPO | Admitting: Family Medicine

## 2012-10-31 VITALS — BP 135/88 | HR 79 | Temp 98.2°F | Resp 16 | Ht 65.0 in | Wt 213.0 lb

## 2012-10-31 DIAGNOSIS — R51 Headache: Secondary | ICD-10-CM

## 2012-10-31 DIAGNOSIS — R110 Nausea: Secondary | ICD-10-CM

## 2012-10-31 DIAGNOSIS — R42 Dizziness and giddiness: Secondary | ICD-10-CM

## 2012-10-31 DIAGNOSIS — H9319 Tinnitus, unspecified ear: Secondary | ICD-10-CM

## 2012-10-31 DIAGNOSIS — H95199 Other disorders following mastoidectomy, unspecified ear: Secondary | ICD-10-CM

## 2012-10-31 MED ORDER — ONDANSETRON 4 MG PO TBDP: 8.0000 mg | ORAL_TABLET | Freq: Once | ORAL | Status: DC

## 2012-10-31 MED ORDER — DIAZEPAM 2 MG PO TABS: ORAL_TABLET | ORAL | Status: DC

## 2012-10-31 MED ORDER — ONDANSETRON HCL 8 MG PO TABS: 8.0000 mg | ORAL_TABLET | Freq: Three times a day (TID) | ORAL | Status: DC | PRN

## 2012-10-31 MED ORDER — GADOBENATE DIMEGLUMINE 529 MG/ML IV SOLN
20.0000 mL | Freq: Once | INTRAVENOUS | Status: AC | PRN
  Administered 2012-10-31: 20 mL via INTRAVENOUS

## 2012-10-31 NOTE — Patient Instructions (Addendum)
MRI  at 12:30pm today  [facility] [address] [phone]

## 2012-10-31 NOTE — Progress Notes (Signed)
[facility] [address], [place] [address] [phone]  Date:  10/31/2012   Name:  [name]   DOB:  09/08/1971   MRN:  [id number]  PCP:  Elvina Sidle, MD    Chief Complaint: Dizziness, Nausea and Headache   History of Present Illness:  [name] is a 42 y.o. very pleasant female patient who presents with the following:  A couple of years ago she had vertigo which resolved without incident.   She now has "severe dizziness" over the last 2 days, worse last night.  She awoke this am feeling better- however she then got dizzy again and decided to come in.   She is also having nausea, no vomiting. She also notes a headache over the last week- it seemed to start with the change in weather.  The HA is over her right eye.  She is using excedrin which does help some.  This is not the worst HA of her life but it is troublesome  The dizziness she feels is a "moving feeling".  It does not come and go quickly- it may last for several hours.  Holding still does not resolve the symptoms.  It does not seem related to motion of her head.   No head injury or trauma She did note some tinnitus yesterday.  Her ears feel congested.  She does not typically have tinnitus.    She does not note any numbness or weakness.    No sign of illness such as fever, cough or ST.    Patient Active Problem List  Diagnosis  . Smoking history  . Status post total hysterectomy    Past Medical History  Diagnosis Date  . No pertinent past medical history   . Fibroids   . DUB (dysfunctional uterine bleeding)   . Smoking history 07/12/11    no hx of  smoking  . Anemia     Past Surgical History  Procedure Date  . Myomectomy 2003  . Cesarean section 2006, 2008    x 2  . Tubal ligation   . Abdominal hysterectomy     History  Substance Use Topics  . Smoking status: Former Games developer  . Smokeless tobacco: Never Used  . Alcohol Use: No    Family History    Problem Relation Age of Onset  . Heart disease Mother     angina  . Cancer Father     prostate    No Known Allergies  Medication list has been reviewed and updated.  Current Outpatient Prescriptions on File Prior to Visit  Medication Sig Dispense Refill  . diclofenac sodium (VOLTAREN) 1 % GEL Apply 1 application topically 4 (four) times daily.  100 g  1  . HYDROcodone-acetaminophen (VICODIN) 5-500 MG per tablet Take 1 tablet by mouth every 4 (four) hours as needed for pain. For pain.  30 tablet  0  . methylPREDNISolone (MEDROL) 4 MG tablet Take 1 tablet (4 mg total) by mouth 2 (two) times daily.  8 tablet  0    Review of Systems:  As per HPI- otherwise negative.   Physical Examination: Filed Vitals:   10/31/12 1011  BP: 135/88  Pulse: 79  Temp: 98.2 F (36.8 C)  Resp: 16   Filed Vitals:   10/31/12 1011  Height: 5\' 5"  (1.651 m)  Weight: 213 lb (96.616 kg)   Body mass index is 35.44 kg/(m^2). Ideal Body Weight: Weight in (lb) to have BMI = 25: 149.9  GEN: WDWN, NAD, Non-toxic, A & O x 3, overweight HEENT: Atraumatic, Normocephalic. Neck supple, no meningismus. No masses, No LAD. Bilateral TM wnl, oropharynx normal.  PEERL,EOMI.   Ears and Nose: No external deformity. CV: RRR, No M/G/R. No JVD. No thrill. No extra heart sounds. PULM: CTA B, no wheezes, crackles, rhonchi. No retractions. No resp. distress. No accessory muscle use. ABD: S, NT, ND, +BS. No rebound. No HSM. EXTR: No c/c/e NEURO Normal gait. Normal strength, sensation, and DTR all extremities  Normal RAM. Romberg- slight wobble but not grossly abnormal.  Positive Dix- Halpike.  PSYCH: Normally interactive. Conversant. Not depressed or anxious appearing.  Calm demeanor.   [facility] reading (PRIMARY) by  Dr. Patsy Lager. Sinus series: negative  Comparison: Head CT 05/12/2004.  Findings: Bone mineralization is within normal limits. Paranasal sinuses appear stable and normally pneumatized on these three views.  Mastoid air cell pneumatization appears symmetric.  IMPRESSION: Negative radiographic sinus series.  Assessment and Plan: 1. Vertigo  MR MRA HEAD W WO CONTRAST, MR Brain W Wo Contrast  2. Nausea  ondansetron (ZOFRAN-ODT) disintegrating tablet 8 mg, MR MRA HEAD W WO CONTRAST, MR Brain W Wo Contrast  3. Headache  DG Sinuses Complete, MR MRA HEAD W WO CONTRAST, MR Brain W Wo Contrast  4. Tinnitus  MR MRA HEAD W WO CONTRAST, MR Brain W Wo Contrast   Gave zofran 4mg  ODT- she felt better.   Persistent vertigo, nausea, HA and tinnitus.  While her symptoms are likely to be idiopathic and resolve, she does need further evaluation.  Discussed CT vs MRI.  As MRI is the more definitive test [name] would like to go ahead and do this now.  Will send for am MRI brain with and without contrast right away.  Further follow- up pending results.   Sabrina Stehr, MD  Normal MRI. Will treat with valium and zofran PRN.  She will let me know if not better in the next couple of days -Sooner if worse.   Meds ordered this encounter  Medications  . ondansetron (ZOFRAN-ODT) disintegrating tablet 8 mg    Sig:   . diazepam (VALIUM) 2 MG tablet    Sig: Take 1 every 8 hours as needed for vertigo    Dispense:  30 tablet    Refill:  0  . ondansetron (ZOFRAN) 8 MG tablet    Sig: Take 1 tablet (8 mg total) by mouth every 8 (eight) hours as needed for nausea.    Dispense:  30 tablet    Refill:  0     MRI HEAD WITHOUT AND WITH CONTRAST  Technique: Multiplanar, multiecho pulse sequences of the brain and surrounding structures were obtained according to standard protocol without and with intravenous contrast  Contrast: 20mL MULTIHANCE GADOBENATE DIMEGLUMINE 529 MG/ML IV SOLN  Comparison: Head CT without contrast 05/12/2004.  Findings: Incidental cavum variegated again noted. No ventriculomegaly. Normal cerebral volume. No restricted diffusion to suggest acute infarction. No midline shift, mass  effect, evidence of mass lesion, extra-axial collection or acute intracranial hemorrhage. Cervicomedullary junction and pituitary are within normal limits. Major intracranial vascular flow voids are preserved, the posterior circulation is diminutive which may relate to fetal PCA origins. The right vertebral artery appears to be dominant. Wallace Cullens and white matter signal is within normal limits throughout the brain.  Negative visualized cervical spine. Normal bone marrow signal. Visualized orbit soft tissues are within normal limits. Paranasal sinuses are clear. Scalp soft tissues are within normal limits.  Dedicated internal auditory canal  imaging. Normal cerebellopontine angles. Normal cisternal and intracanilicular seventh and eighth nerves segments. No abnormal enhancement identified. Incidental asymmetric pneumatization of the right petrous apex. Mastoids are clear. Preserved T2 signal in the bilateral cochlea and vestibular structures.  IMPRESSION: 1. Negative IAC imaging. 2. Normal MRI appearance of the brain.

## 2012-11-02 ENCOUNTER — Telehealth: Payer: Self-pay

## 2012-11-02 NOTE — Telephone Encounter (Signed)
Pt is requesting an OOW note for Wed - Friday    Also, valium is knocking her out and would like to discuss this.    [phone]

## 2012-11-02 NOTE — Telephone Encounter (Signed)
Note written patient advised. She should take 1/2 a tablet and take it with food. She will try this.

## 2013-02-14 ENCOUNTER — Ambulatory Visit (INDEPENDENT_AMBULATORY_CARE_PROVIDER_SITE_OTHER): Payer: BC Managed Care – PPO | Admitting: Family Medicine

## 2013-02-14 VITALS — BP 136/82 | HR 84 | Temp 99.0°F | Resp 18 | Ht 65.0 in | Wt 218.0 lb

## 2013-02-14 DIAGNOSIS — R05 Cough: Secondary | ICD-10-CM

## 2013-02-14 DIAGNOSIS — J309 Allergic rhinitis, unspecified: Secondary | ICD-10-CM

## 2013-02-14 DIAGNOSIS — R059 Cough, unspecified: Secondary | ICD-10-CM

## 2013-02-14 DIAGNOSIS — J302 Other seasonal allergic rhinitis: Secondary | ICD-10-CM

## 2013-02-14 DIAGNOSIS — R0602 Shortness of breath: Secondary | ICD-10-CM

## 2013-02-14 MED ORDER — PROMETHAZINE-DM 6.25-15 MG/5ML PO SYRP: 5.0000 mL | ORAL_SOLUTION | Freq: Four times a day (QID) | ORAL | Status: DC | PRN

## 2013-02-14 MED ORDER — ALBUTEROL SULFATE HFA 108 (90 BASE) MCG/ACT IN AERS: 2.0000 | INHALATION_SPRAY | Freq: Four times a day (QID) | RESPIRATORY_TRACT | Status: DC | PRN

## 2013-02-14 NOTE — Progress Notes (Signed)
[facility]:  Office Visit  Chief Complaint:  Chief Complaint  Patient presents with  . Cough    6 days  . Nasal Congestion    difficulty taking deep breaths  . Headache  . Chest Pain    cough and congestion tightness    HPI: [name] is a 42 y.o. female who complains of  1 week history of dry cough, nasal and chest congestion, frontal HA, sinus x 6 days. Has not tried anything for it. She does not like taking nasal sprays. Has tried son's albuterol nebs with relief. Denies h/o asthma, allergies. Son has allergies and eczema. She is a nonsmoker.  Denies fevers, chills. Cough is nondependent on time of day or being indoor or outdoors. Denies fevers, chills,ear pain.  Chest pressure with coughing only.  Takes Sudafed but makes her vertigo worse and HA worse.    Past Medical History  Diagnosis Date  . No pertinent past medical history   . Fibroids   . DUB (dysfunctional uterine bleeding)   . Smoking history 07/12/11    no hx of  smoking  . Anemia    Past Surgical History  Procedure Laterality Date  . Myomectomy  2003  . Cesarean section  2006, 2008    x 2  . Tubal ligation    . Abdominal hysterectomy     History   Social History  . Marital Status: Married    Spouse Name: N/A    Number of Children: N/A  . Years of Education: N/A   Social History Main Topics  . Smoking status: Former Games developer  . Smokeless tobacco: Never Used  . Alcohol Use: No  . Drug Use: No  . Sexually Active: Yes   Other Topics Concern  . None   Social History Narrative  . None   Family History  Problem Relation Age of Onset  . Heart disease Mother     angina  . Cancer Father     prostate   No Known Allergies Prior to Admission medications   Medication Sig Start Date End Date Taking? Authorizing Provider  diazepam (VALIUM) 2 MG tablet Take 1 every 8 hours as needed for vertigo 10/31/12   Gwenlyn Found Copland, MD  diclofenac sodium (VOLTAREN) 1 % GEL Apply  1 application topically 4 (four) times daily. 02/29/12   Elvina Sidle, MD  ondansetron (ZOFRAN) 8 MG tablet Take 1 tablet (8 mg total) by mouth every 8 (eight) hours as needed for nausea. 10/31/12   Gwenlyn Found Copland, MD     ROS: The patient denies fevers, chills, night sweats, unintentional weight loss, chest pain, palpitations,  nausea, vomiting, abdominal pain, dysuria, hematuria, melena, numbness, weakness, or tingling.   All other systems have been reviewed and were otherwise negative with the exception of those mentioned in the HPI and as above.    PHYSICAL EXAM: Filed Vitals:   02/14/13 1713  BP: 136/82  Pulse: 84  Temp: 99 F (37.2 C)  Resp: 18   Filed Vitals:   02/14/13 1713  Height: 5\' 5"  (1.651 m)  Weight: 218 lb (98.884 kg)   Body mass index is 36.28 kg/(m^2).  General: Alert, no acute distress HEENT:  Normocephalic, atraumatic, oropharynx patent.  TM nl, No exudates, + very minimal sinus tenderenss, erythematous bilateral nasal mucosa Cardiovascular:  Regular rate and rhythm, no rubs murmurs or gallops.  No Carotid bruits, radial pulse intact. No pedal edema.  Respiratory: Clear to auscultation bilaterally.  No wheezes, rales, or rhonchi.  No cyanosis, no use of accessory musculature GI: No organomegaly, abdomen is soft and non-tender, positive bowel sounds.  No masses. Skin: No rashes. Neurologic: Facial musculature symmetric. Psychiatric: Patient is appropriate throughout our interaction. Lymphatic: No cervical lymphadenopathy Musculoskeletal: Gait intact.   LABS:    EKG/XRAY:   Primary read interpreted by Dr. Conley Rolls at [facility].   ASSESSMENT/PLAN: Encounter Diagnoses  Name Primary?  . Seasonal allergies Yes  . Allergic rhinitis   . SOB (shortness of breath)   . Cough    Declined xray , will return if worse  Declined breathing treatment, will go home to get albuterol nebs done Predicted peak flow 433----she blew out 310 x 2  Rx albuterol inh Rx  Promethazine-Dextroporphan OTC antihistamine without Decongestant If no improvement then call and I will consider giving abx F/u prn   Sabrina Mendonca PHUONG, DO 02/14/2013 5:52 PM

## 2013-02-14 NOTE — Patient Instructions (Signed)
Allergies, Generic  Allergies may happen from anything your body is sensitive to. This may be food, medicines, pollens, chemicals, and nearly anything around you in everyday life that produces allergens. An allergen is anything that causes an allergy producing substance. Heredity is often a factor in causing these problems. This means you may have some of the same allergies as your parents.  Food allergies happen in all age groups. Food allergies are some of the most severe and life threatening. Some common food allergies are cow's milk, seafood, eggs, nuts, wheat, and soybeans.  SYMPTOMS    Swelling around the mouth.   An itchy red rash or hives.   Vomiting or diarrhea.   Difficulty breathing.  SEVERE ALLERGIC REACTIONS ARE LIFE-THREATENING.  This reaction is called anaphylaxis. It can cause the mouth and throat to swell and cause difficulty with breathing and swallowing. In severe reactions only a trace amount of food (for example, peanut oil in a salad) may cause death within seconds.  Seasonal allergies occur in all age groups. These are seasonal because they usually occur during the same season every year. They may be a reaction to molds, grass pollens, or tree pollens. Other causes of problems are house dust mite allergens, pet dander, and mold spores. The symptoms often consist of nasal congestion, a runny itchy nose associated with sneezing, and tearing itchy eyes. There is often an associated itching of the mouth and ears. The problems happen when you come in contact with pollens and other allergens. Allergens are the particles in the air that the body reacts to with an allergic reaction. This causes you to release allergic antibodies. Through a chain of events, these eventually cause you to release histamine into the blood stream. Although it is meant to be protective to the body, it is this release that causes your discomfort. This is why you were given anti-histamines to feel better. If you are  unable to pinpoint the offending allergen, it may be determined by skin or blood testing. Allergies cannot be cured but can be controlled with medicine.  Hay fever is a collection of all or some of the seasonal allergy problems. It may often be treated with simple over-the-counter medicine such as diphenhydramine. Take medicine as directed. Do not drink alcohol or drive while taking this medicine. Check with your caregiver or package insert for child dosages.  If these medicines are not effective, there are many new medicines your caregiver can prescribe. Stronger medicine such as nasal spray, eye drops, and corticosteroids may be used if the first things you try do not work well. Other treatments such as immunotherapy or desensitizing injections can be used if all else fails. Follow up with your caregiver if problems continue. These seasonal allergies are usually not life threatening. They are generally more of a nuisance that can often be handled using medicine.  HOME CARE INSTRUCTIONS    If unsure what causes a reaction, keep a diary of foods eaten and symptoms that follow. Avoid foods that cause reactions.   If hives or rash are present:   Take medicine as directed.   You may use an over-the-counter antihistamine (diphenhydramine) for hives and itching as needed.   Apply cold compresses (cloths) to the skin or take baths in cool water. Avoid hot baths or showers. Heat will make a rash and itching worse.   If you are severely allergic:   Following a treatment for a severe reaction, hospitalization is often required for closer follow-up.     Wear a medic-alert bracelet or necklace stating the allergy.   You and your family must learn how to give adrenaline or use an anaphylaxis kit.   If you have had a severe reaction, always carry your anaphylaxis kit or EpiPen with you. Use this medicine as directed by your caregiver if a severe reaction is occurring. Failure to do so could have a fatal outcome.  SEEK  MEDICAL CARE IF:   You suspect a food allergy. Symptoms generally happen within 30 minutes of eating a food.   Your symptoms have not gone away within 2 days or are getting worse.   You develop new symptoms.   You want to retest yourself or your child with a food or drink you think causes an allergic reaction. Never do this if an anaphylactic reaction to that food or drink has happened before. Only do this under the care of a caregiver.  SEEK IMMEDIATE MEDICAL CARE IF:    You have difficulty breathing, are wheezing, or have a tight feeling in your chest or throat.   You have a swollen mouth, or you have hives, swelling, or itching all over your body.   You have had a severe reaction that has responded to your anaphylaxis kit or an EpiPen. These reactions may return when the medicine has worn off. These reactions should be considered life threatening.  MAKE SURE YOU:    Understand these instructions.   Will watch your condition.   Will get help right away if you are not doing well or get worse.  Document Released: 12/27/2002 Document Revised: 12/26/2011 Document Reviewed: 06/02/2008  Exit[employer] Patient Information 2013 [employer].

## 2013-03-26 ENCOUNTER — Ambulatory Visit (INDEPENDENT_AMBULATORY_CARE_PROVIDER_SITE_OTHER): Payer: BC Managed Care – PPO | Admitting: Internal Medicine

## 2013-03-26 VITALS — BP 137/87 | HR 82 | Temp 98.5°F | Resp 16 | Ht 66.0 in | Wt 218.6 lb

## 2013-03-26 DIAGNOSIS — D649 Anemia, unspecified: Secondary | ICD-10-CM

## 2013-03-26 DIAGNOSIS — R319 Hematuria, unspecified: Secondary | ICD-10-CM

## 2013-03-26 DIAGNOSIS — R1031 Right lower quadrant pain: Secondary | ICD-10-CM

## 2013-03-26 DIAGNOSIS — R61 Generalized hyperhidrosis: Secondary | ICD-10-CM

## 2013-03-26 LAB — POCT CBC
Granulocyte percent: 53.9 %G (ref 37–80)
HCT, POC: 41.3 % (ref 37.7–47.9)
Hemoglobin: 12.3 g/dL (ref 12.2–16.2)
POC Granulocyte: 4.2 (ref 2–6.9)
POC LYMPH PERCENT: 34.7 %L (ref 10–50)
RBC: 4.68 M/uL (ref 4.04–5.48)
RDW, POC: 13.8 %

## 2013-03-26 LAB — POCT UA - MICROSCOPIC ONLY
Mucus, UA: NEGATIVE
WBC, Ur, HPF, POC: NEGATIVE
Yeast, UA: NEGATIVE

## 2013-03-26 LAB — POCT URINALYSIS DIPSTICK
Ketones, UA: NEGATIVE
Leukocytes, UA: NEGATIVE
Protein, UA: NEGATIVE

## 2013-03-26 NOTE — Progress Notes (Signed)
Subjective:    Patient ID: [name], female    DOB: February 05, 1971, 42 y.o.   MRN: [id number]  HPI Nausea and pain RLQ 11 days Intermit No constipation or diarrhea No vomiting No fever Nondisabling Day and night Not made worse by eating but appetite diminished nausea No history of reflux No dysuria or frequency  Past Medical History  Diagnosis Date      . Fibroids   . DUB (dysfunctional uterine bleeding)     07/12/11    no hx of  smoking  . Anemia   . Allergy   . Hypertension---?    Past Surgical History  Procedure Laterality Date  . Myomectomy  2003  . Cesarean section  2006, 2008    x 2  . Tubal ligation    . Abdominal hysterectomy      Review of Systems Night sweats for 1-2 months No wt loss S/p hyst and has gyn eval on Friday-Dr Cousins    Objective:   Physical Exam BP 137/87  Pulse 82  Temp(Src) 98.5 F (36.9 C) (Oral)  Resp 16  Ht 5\' 6"  (1.676 m)  Wt 218 lb 9.6 oz (99.156 kg)  BMI 35.3 kg/m2  SpO2 100%  LMP 06/13/2011 Heart regular without murmur Lungs clear Abdomen soft and mildly tender right upper quadrant and right lower quadrant without rebound tenderness/no organomegaly or masses       Results for orders placed in visit on 03/26/13  POCT CBC      Result Value Range   WBC 7.7  4.6 - 10.2 K/uL   Lymph, poc 2.7  0.6 - 3.4   POC LYMPH PERCENT 34.7  10 - 50 %L   MID (cbc) 0.9  0 - 0.9   POC MID % 11.4  0 - 12 %M   POC Granulocyte 4.2  2 - 6.9   Granulocyte percent 53.9  37 - 80 %G   RBC 4.68  4.04 - 5.48 M/uL   Hemoglobin 12.3  12.2 - 16.2 g/dL   HCT, POC 40.9  81.1 - 47.9 %   MCV 88.2  80 - 97 fL   MCH, POC 26.3 (*) 27 - 31.2 pg   MCHC 29.8 (*) 31.8 - 35.4 g/dL   RDW, POC 91.4     Platelet Count, POC 380  142 - 424 K/uL   MPV 8.0  0 - 99.8 fL  POCT URINALYSIS DIPSTICK      Result Value Range   Color, UA yellow     Clarity, UA clear     Glucose, UA neg     Bilirubin, UA neg     Ketones, UA neg     Spec Grav, UA 1.025      Blood, UA moderate     pH, UA 5.5     Protein, UA neg     Urobilinogen, UA 0.2     Nitrite, UA neg     Leukocytes, UA Negative    POCT UA - MICROSCOPIC ONLY      Result Value Range   WBC, Ur, HPF, POC neg     RBC, urine, microscopic 1-2     Bacteria, U Microscopic neg     Mucus, UA neg     Epithelial cells, urine per micros 0-1     Crystals, Ur, HPF, POC neg     Casts, Ur, LPF, POC neg     Yeast, UA neg      Assessment & Plan:  RLQ  abdominal pain-eval Dr Cherly Hensen Caleen Essex to r/o pelvic cause  Anemia  Hematuria - Plan: Urine culture  Night sweats-if GYN cause not identified but explains persistent right lower quadrant pain with night sweats then she will need a CT the abdomen and pelvis with contrast to rule out malignancy  Results for orders placed in visit on 03/26/13  COMPREHENSIVE METABOLIC PANEL      Result Value Range   Sodium 138  135 - 145 mEq/L   Potassium 3.8  3.5 - 5.3 mEq/L   Chloride 101  96 - 112 mEq/L   CO2 27  19 - 32 mEq/L   Glucose, Bld 95  70 - 99 mg/dL   BUN 12  6 - 23 mg/dL   Creat 1.61  0.96 - 0.45 mg/dL   Total Bilirubin 0.2 (*) 0.3 - 1.2 mg/dL   Alkaline Phosphatase 72  39 - 117 U/L   AST 20  0 - 37 U/L   ALT 18  0 - 35 U/L   Total Protein 7.7  6.0 - 8.3 g/dL   Albumin 4.3  3.5 - 5.2 g/dL   Calcium 9.5  8.4 - 40.9 mg/dL

## 2013-03-27 LAB — COMPREHENSIVE METABOLIC PANEL
ALT: 18 U/L (ref 0–35)
CO2: 27 mEq/L (ref 19–32)
Calcium: 9.5 mg/dL (ref 8.4–10.5)
Chloride: 101 mEq/L (ref 96–112)
Creat: 0.65 mg/dL (ref 0.50–1.10)
Sodium: 138 mEq/L (ref 135–145)
Total Protein: 7.7 g/dL (ref 6.0–8.3)

## 2013-03-28 LAB — URINE CULTURE: Colony Count: NO GROWTH

## 2013-05-09 ENCOUNTER — Ambulatory Visit (INDEPENDENT_AMBULATORY_CARE_PROVIDER_SITE_OTHER): Payer: BC Managed Care – PPO | Admitting: Emergency Medicine

## 2013-05-09 ENCOUNTER — Ambulatory Visit: Payer: BC Managed Care – PPO

## 2013-05-09 VITALS — BP 120/82 | HR 82 | Temp 98.2°F | Resp 16 | Ht 65.5 in | Wt 219.0 lb

## 2013-05-09 DIAGNOSIS — M25571 Pain in right ankle and joints of right foot: Secondary | ICD-10-CM

## 2013-05-09 DIAGNOSIS — S93409A Sprain of unspecified ligament of unspecified ankle, initial encounter: Secondary | ICD-10-CM

## 2013-05-09 MED ORDER — HYDROCODONE-ACETAMINOPHEN 5-325 MG PO TABS
1.0000 | ORAL_TABLET | ORAL | Status: DC | PRN
Start: 2013-05-09 — End: 2015-06-23

## 2013-05-09 NOTE — Patient Instructions (Addendum)

## 2013-05-09 NOTE — Progress Notes (Signed)
[facility] [address], [place] [address] [phone]  Date:  05/09/2013   Name:  [name]   DOB:  Jan 19, 1971   MRN:  [id number]  PCP:  Elvina Sidle, MD    Chief Complaint: Ankle Injury   History of Present Illness:  [name] is a 42 y.o. very pleasant female patient who presents with the following:  Injured last night getting out of the pool following an exercise class.  She rolled her ankle and has pain in the lateral ankle and lateral mid foot.  No improvement with over the counter medications or other home remedies. Pain with ambulation.  Denies other complaint or health concern today.   Patient Active Problem List   Diagnosis Date Noted  . Anemia 03/26/2013  . Smoking history 07/12/2011  . 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 No pertinent past medical history   . Fibroids   . DUB (dysfunctional uterine bleeding)   . Smoking history 07/12/11    no hx of  smoking  . Anemia   . Allergy   . Hypertension     Past Surgical History  Procedure Laterality Date  . Myomectomy  2003  . Cesarean section  2006, 2008    x 2  . Tubal ligation    . Abdominal hysterectomy      History  Substance Use Topics  . Smoking status: Former Games developer  . Smokeless tobacco: Never Used  . Alcohol Use: No    Family History  Problem Relation Age of Onset  . Heart disease Mother     angina  . Cancer Father     prostate    No Known Allergies  Medication list has been reviewed and updated.  No current outpatient prescriptions on file prior to visit.   No current facility-administered medications on file prior to visit.    Review of Systems:  As per HPI, otherwise negative.    Physical Examination: Filed Vitals:   05/09/13 1956  BP: 120/82  Pulse: 82  Temp: 98.2 F (36.8 C)  Resp: 16   Filed Vitals:   05/09/13 1956  Height: 5' 5.5" (1.664 m)  Weight: 219 lb  (99.338 kg)   Body mass index is 35.88 kg/(m^2). Ideal Body Weight: Weight in (lb) to have BMI = 25: 152.2   GEN: WDWN, NAD, Non-toxic, Alert & Oriented x 3 HEENT: Atraumatic, Normocephalic.  Ears and Nose: No external deformity. EXTR: No clubbing/cyanosis/edema NEURO: antalgic gait.  PSYCH: Normally interactive. Conversant. Not depressed or anxious appearing.  Calm demeanor.  RIGHT ankle and foot:  Tender and swollen lateral ankle and over fifth MT.  No deformity or ecchymosis  Assessment and Plan: Sprain ankle Boot Follow up in one week vicodin  Signed,  Phillips Odor, MD   [facility] reading (PRIMARY) by  Dr. Dareen Piano.  Ankle negative.  [facility] reading (PRIMARY) by  Dr. Dareen Piano.  Foot.  negative.

## 2013-07-11 ENCOUNTER — Other Ambulatory Visit: Payer: Self-pay | Admitting: Obstetrics and Gynecology

## 2013-07-11 DIAGNOSIS — R599 Enlarged lymph nodes, unspecified: Secondary | ICD-10-CM

## 2013-07-23 ENCOUNTER — Ambulatory Visit
Admission: RE | Admit: 2013-07-23 | Discharge: 2013-07-23 | Disposition: A | Discharge status: Home / Self Care | Payer: BC Managed Care – PPO | Source: Ambulatory Visit | Attending: Obstetrics and Gynecology | Admitting: Obstetrics and Gynecology

## 2013-07-23 DIAGNOSIS — R599 Enlarged lymph nodes, unspecified: Secondary | ICD-10-CM

## 2013-12-01 IMAGING — CR DG CHEST 2V
2 series · 2 of 2 positions shown · non-contrast
Comparison: None

CLINICAL DATA: Physical examination.

CHEST - 2 VIEW

[lateral]
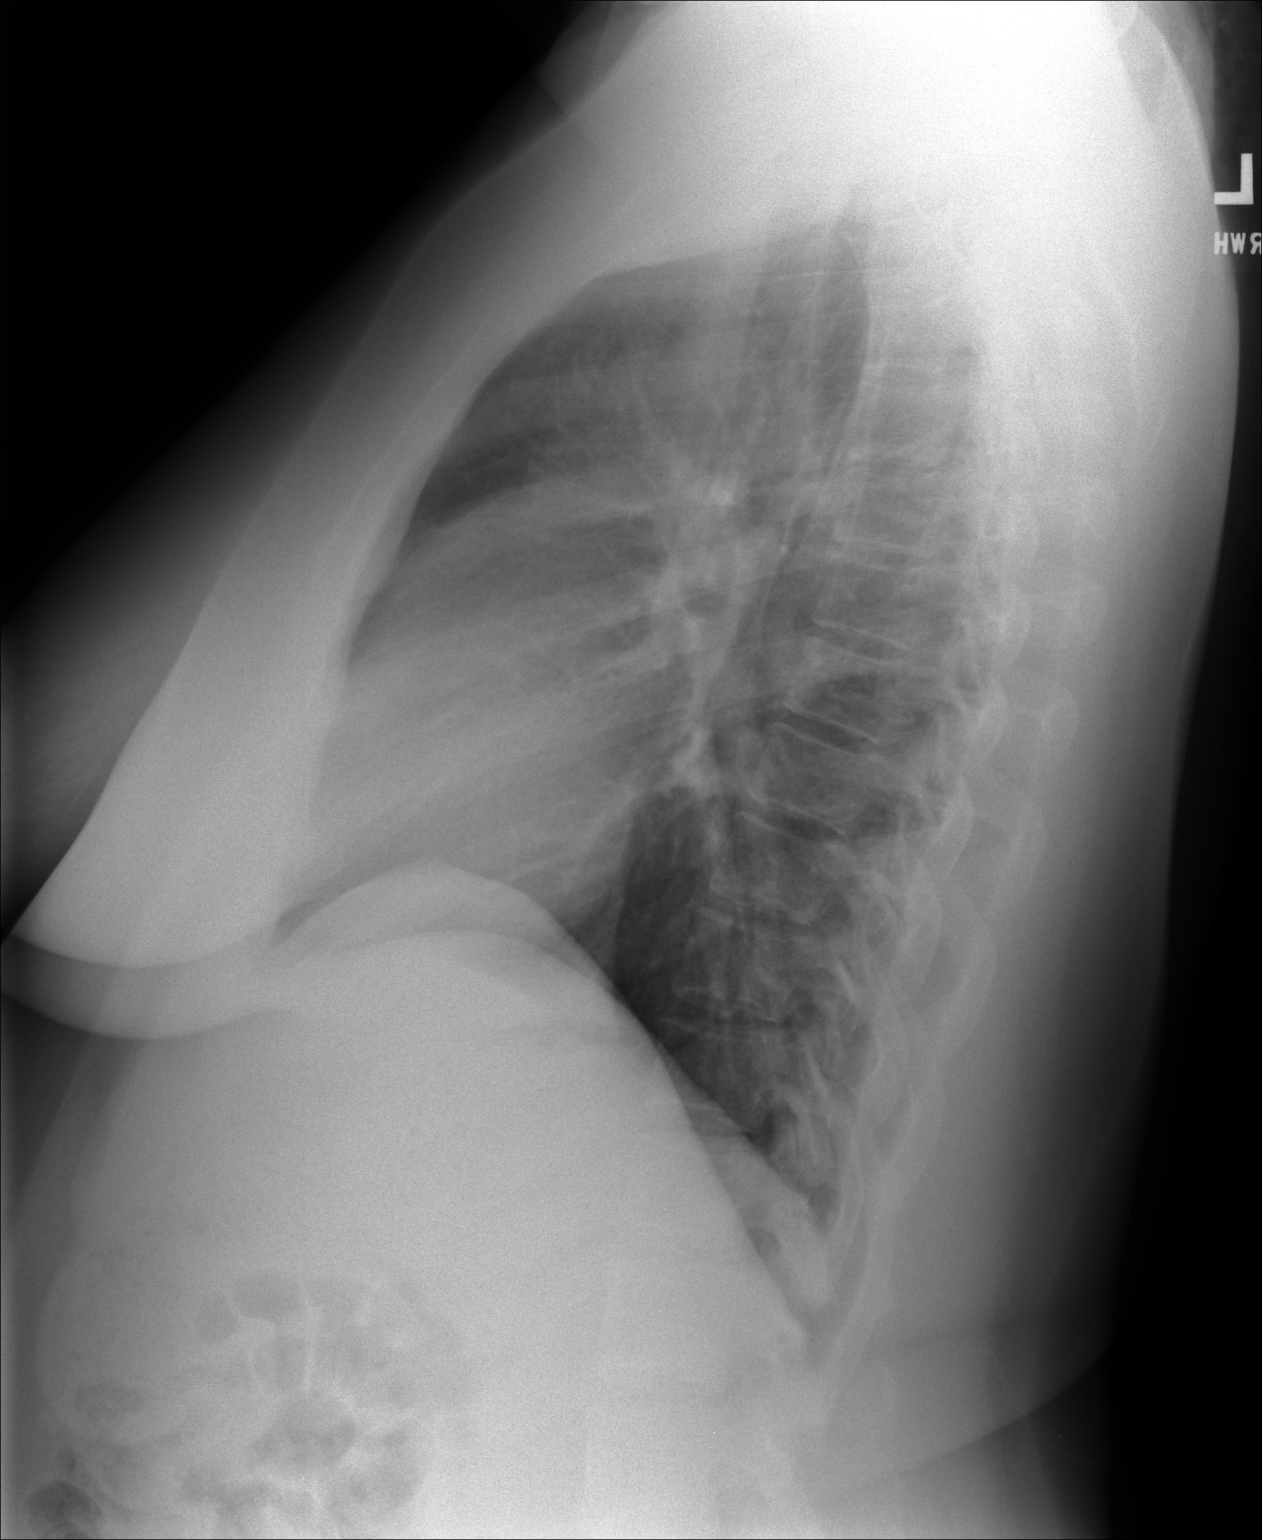

[PA]
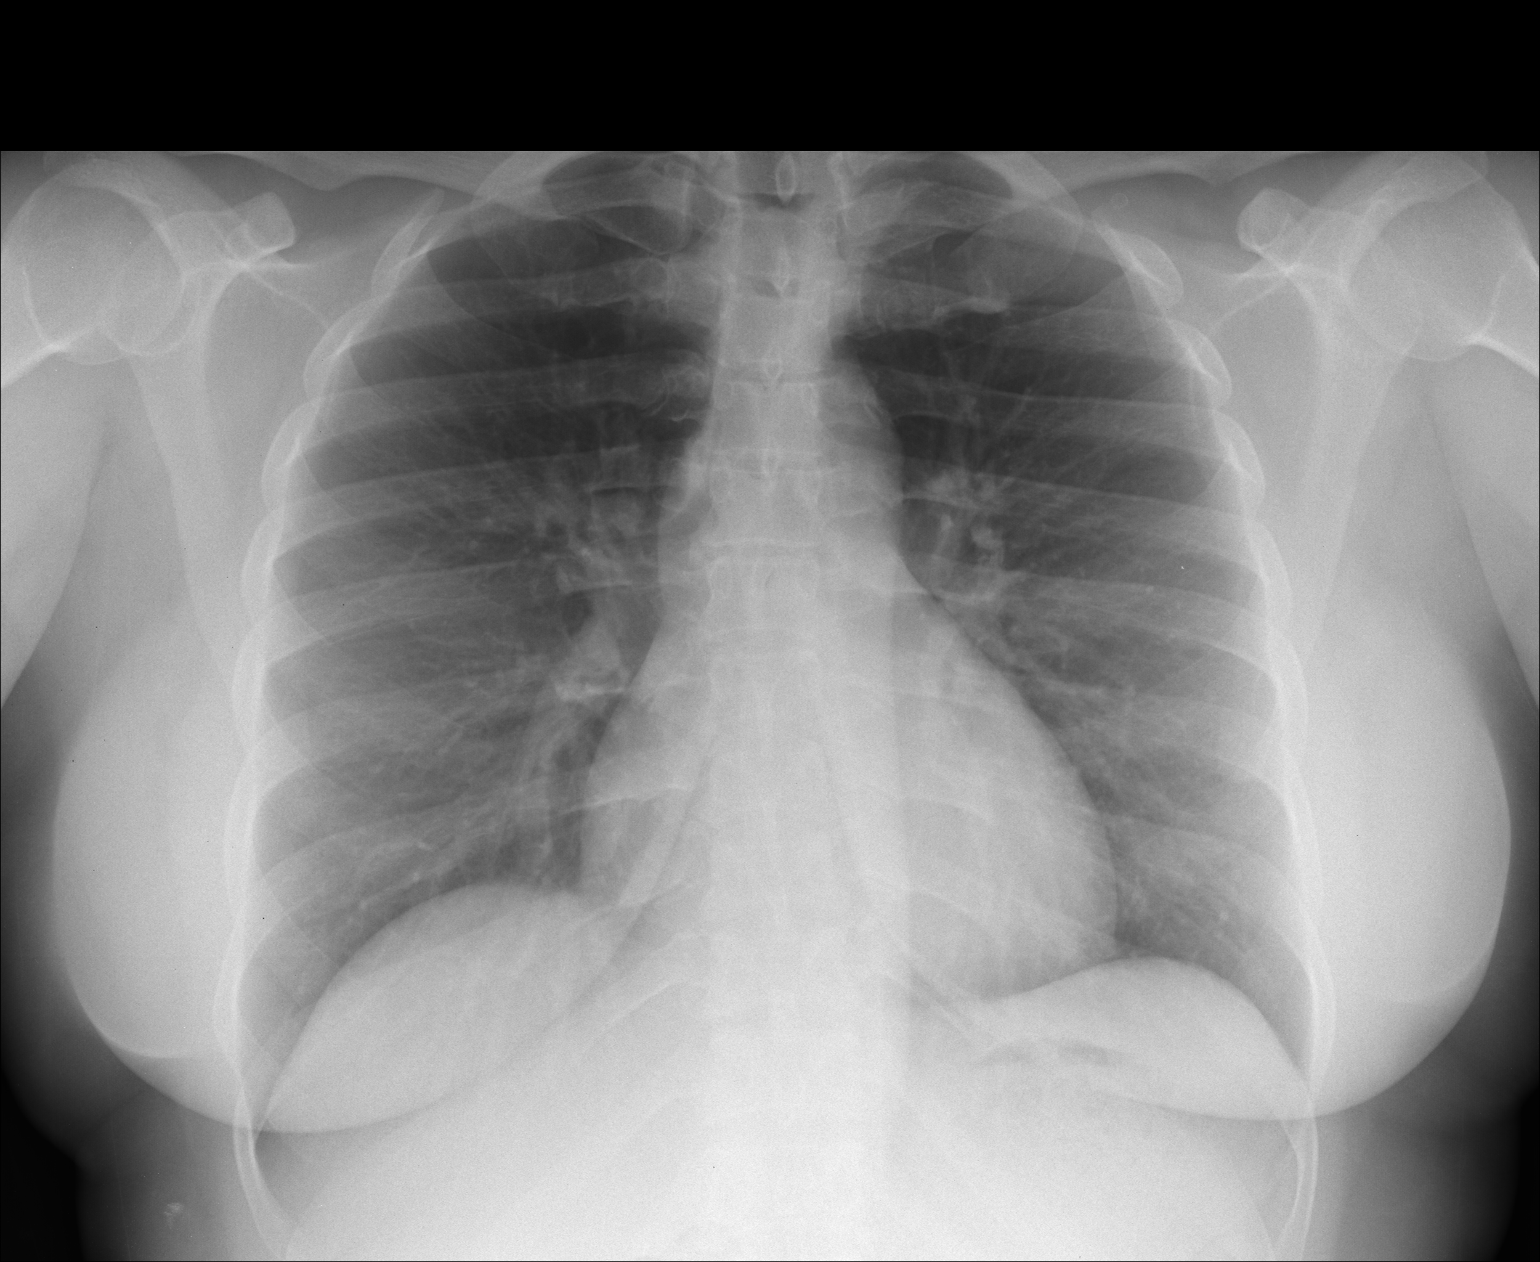

[2 of 2 positions shown; findings below may reference images not displayed]

FINDINGS: The heart size and mediastinal contours are within normal
limits.  Both lungs are clear.  The visualized skeletal structures
are unremarkable.
IMPRESSION: No active disease.

## 2014-07-23 IMAGING — CR DG ANKLE COMPLETE 3+V*R*
2 series · 2 of 2 positions shown · non-contrast
Comparison: None.

CLINICAL DATA: Ankle pain

RIGHT ANKLE - COMPLETE 3+ VIEW

[AP (1 of 2)]
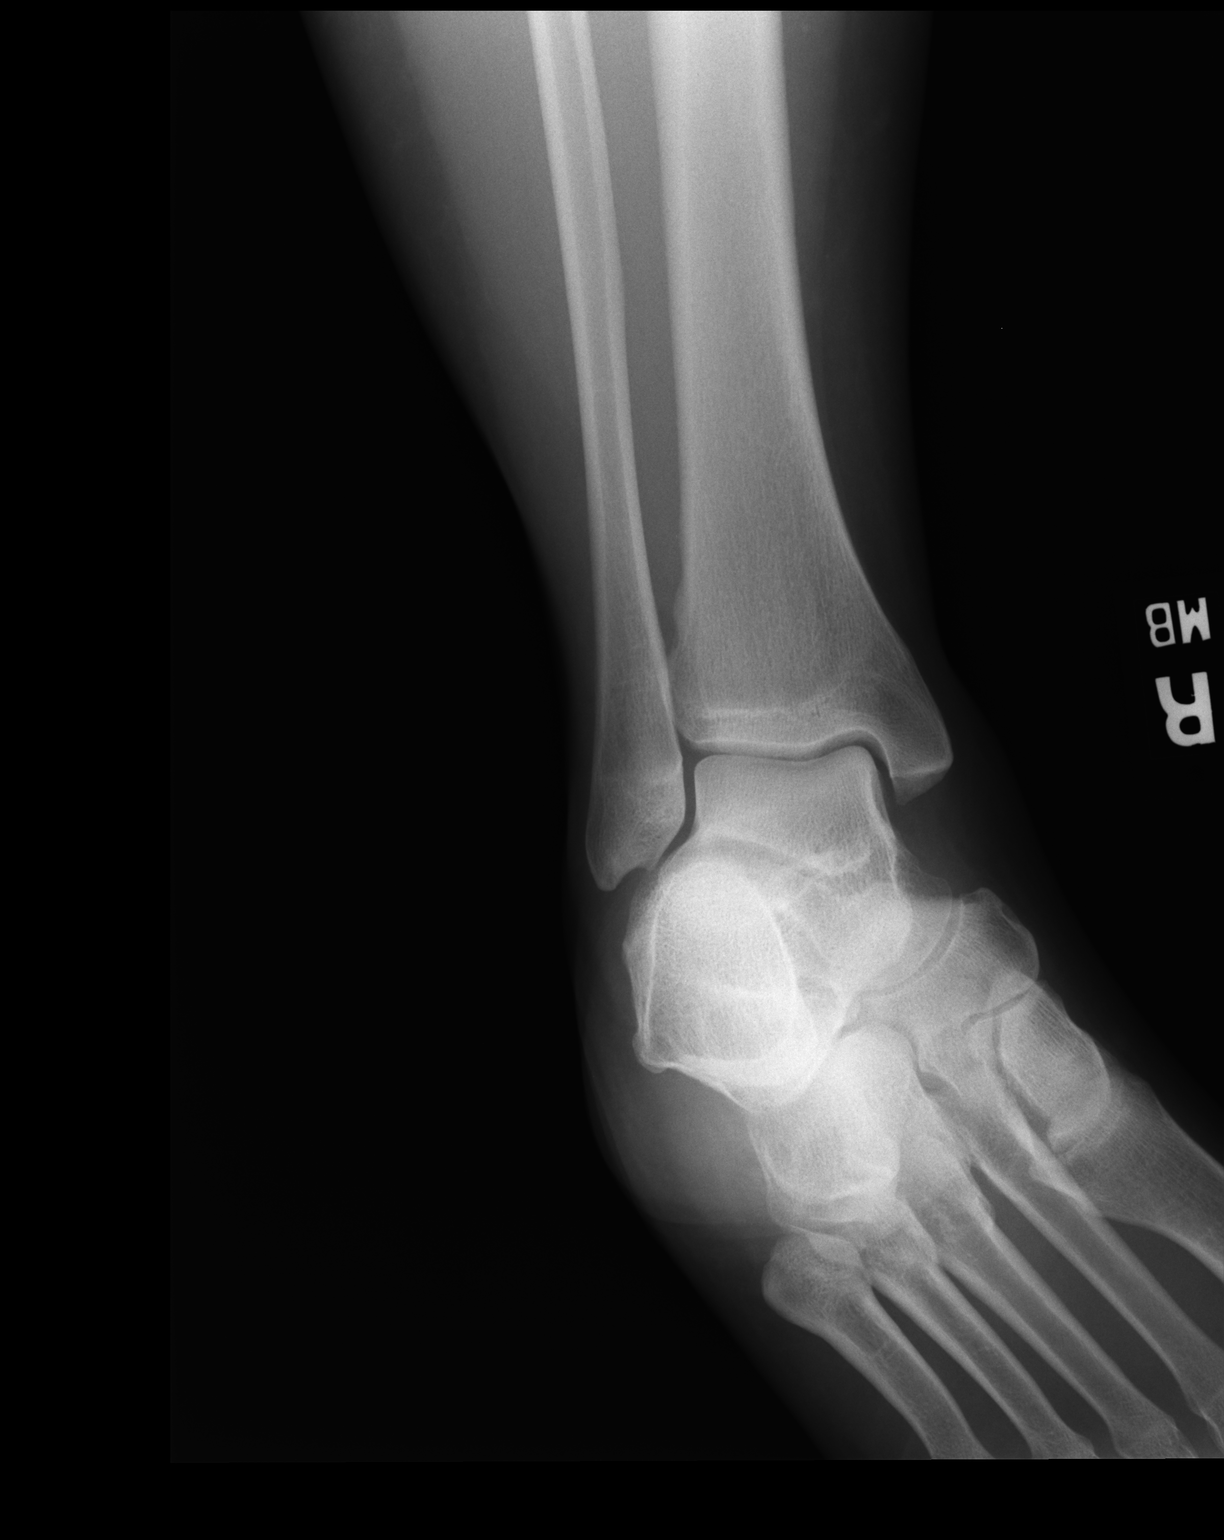

[AP (2 of 2)]
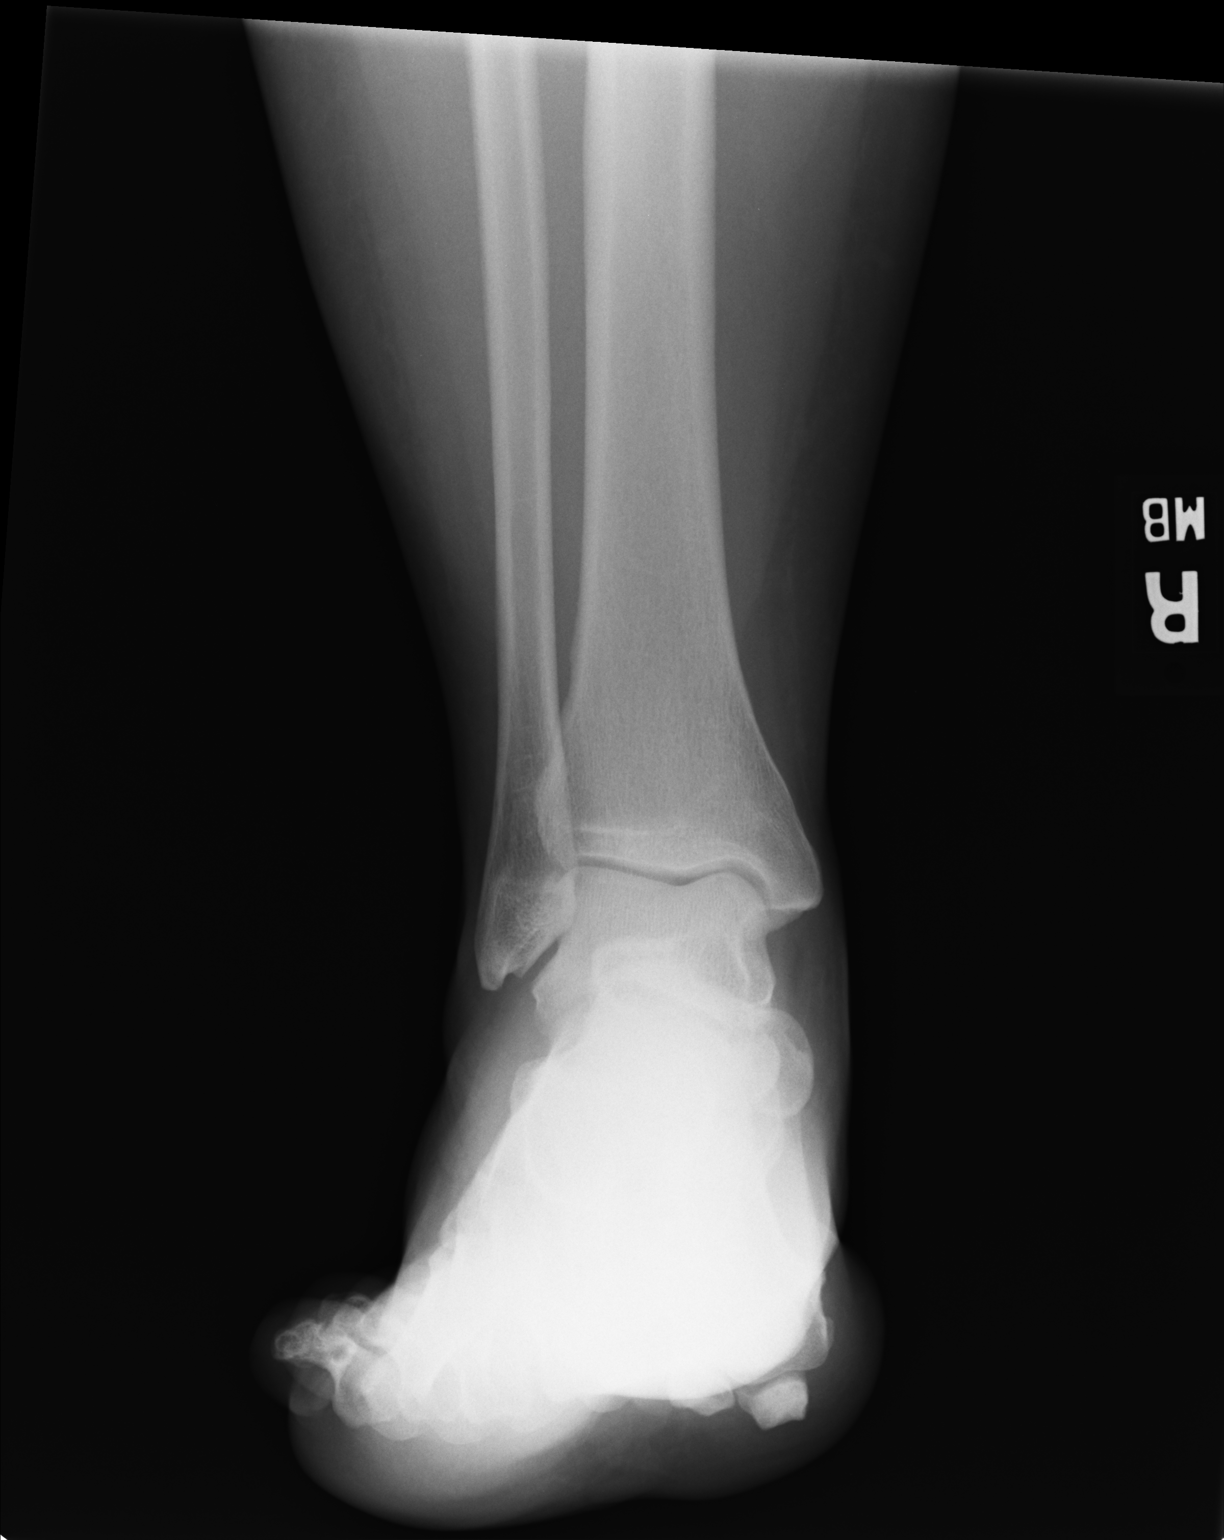

[2 of 2 positions shown; findings below may reference images not displayed]

FINDINGS: Ankle mortise intact and the talar dome is normal.  No
malleolar fracture.  No joint effusion.
IMPRESSION: No ankle fracture.

Clinically significant discrepancy from primary report, if
provided: None

## 2014-07-23 IMAGING — CR DG FOOT COMPLETE 3+V*R*
3 series · 3 of 3 positions shown · non-contrast
Comparison: None.

CLINICAL DATA: Foot pain

RIGHT FOOT COMPLETE - 3+ VIEW

[AP]
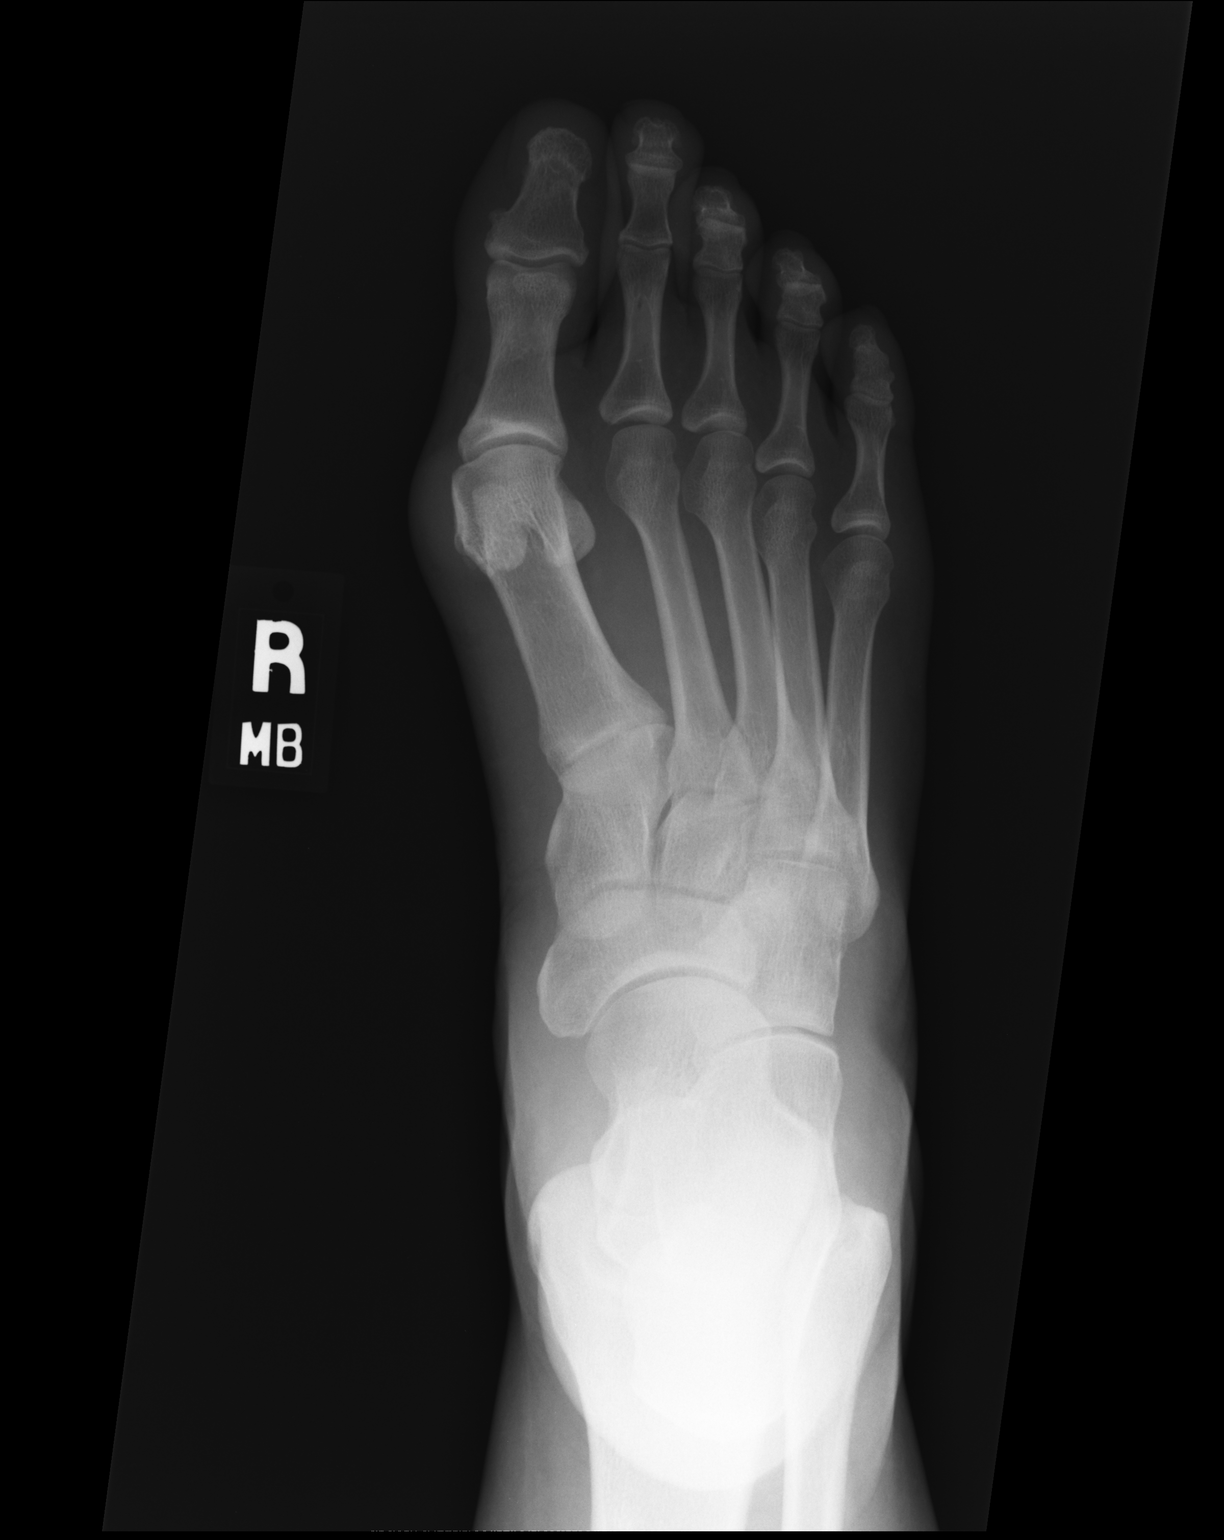

[ap obl int rot]
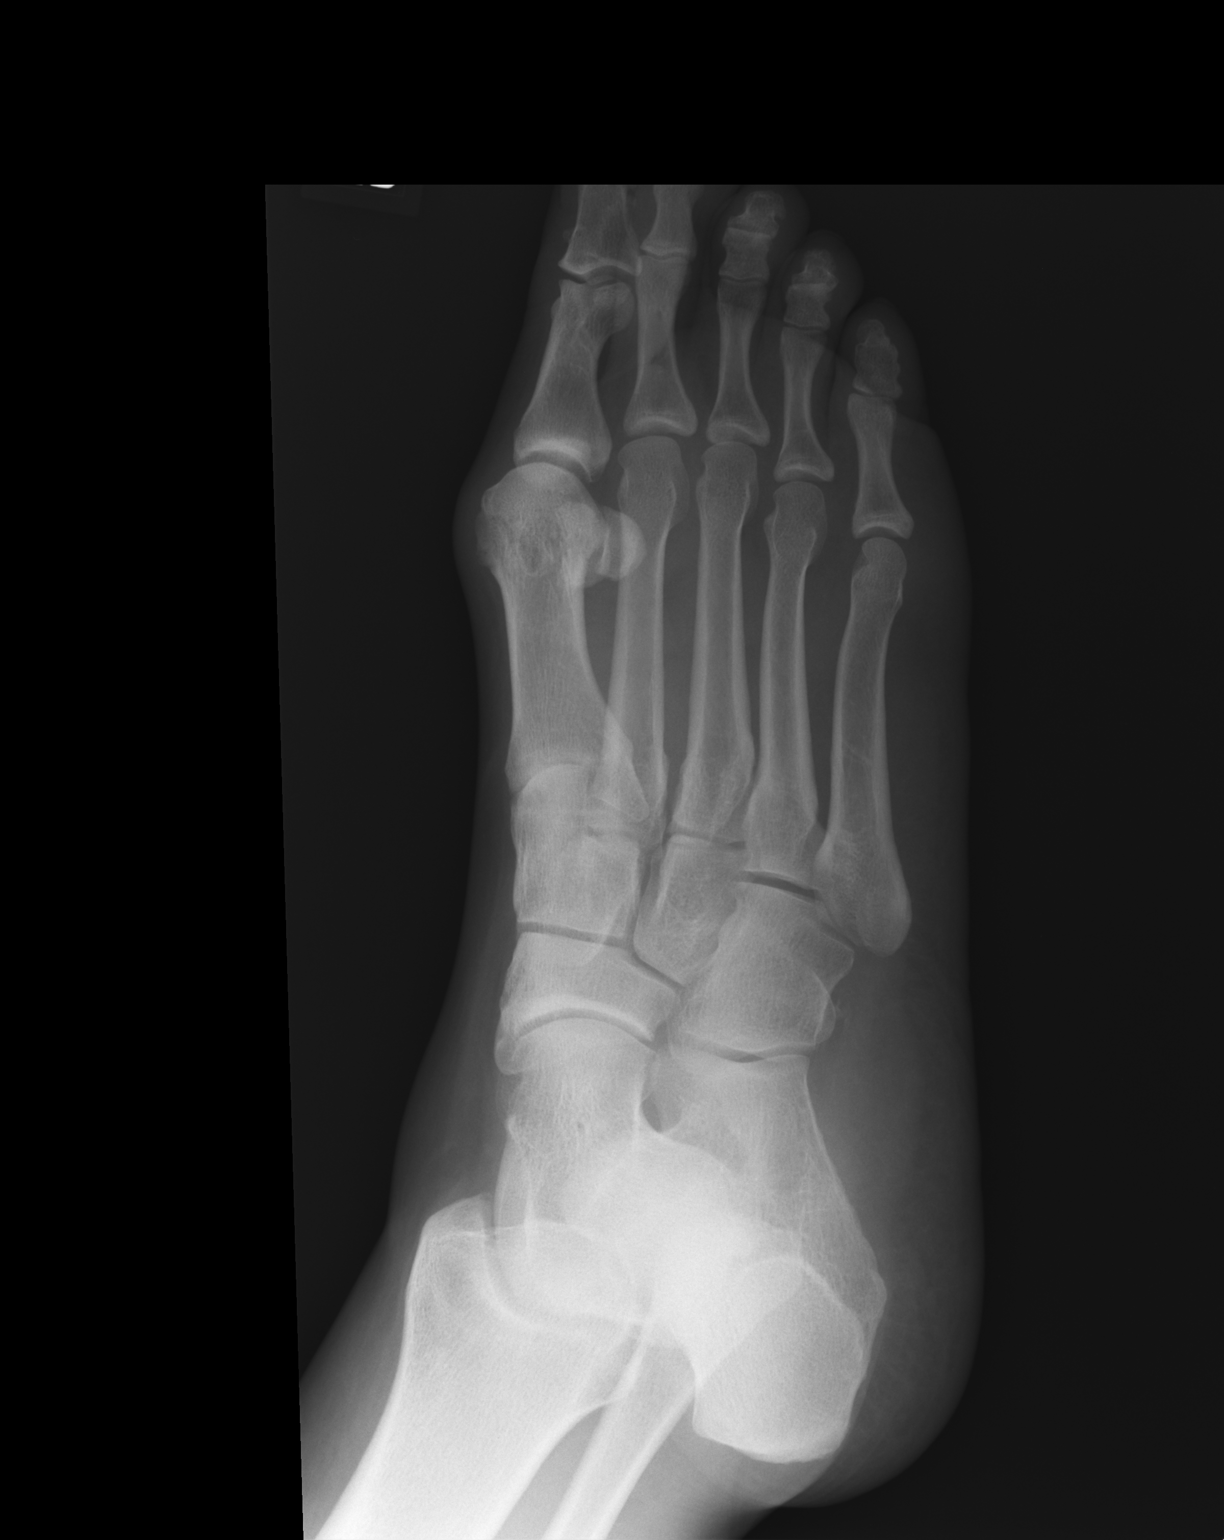

[lateral]
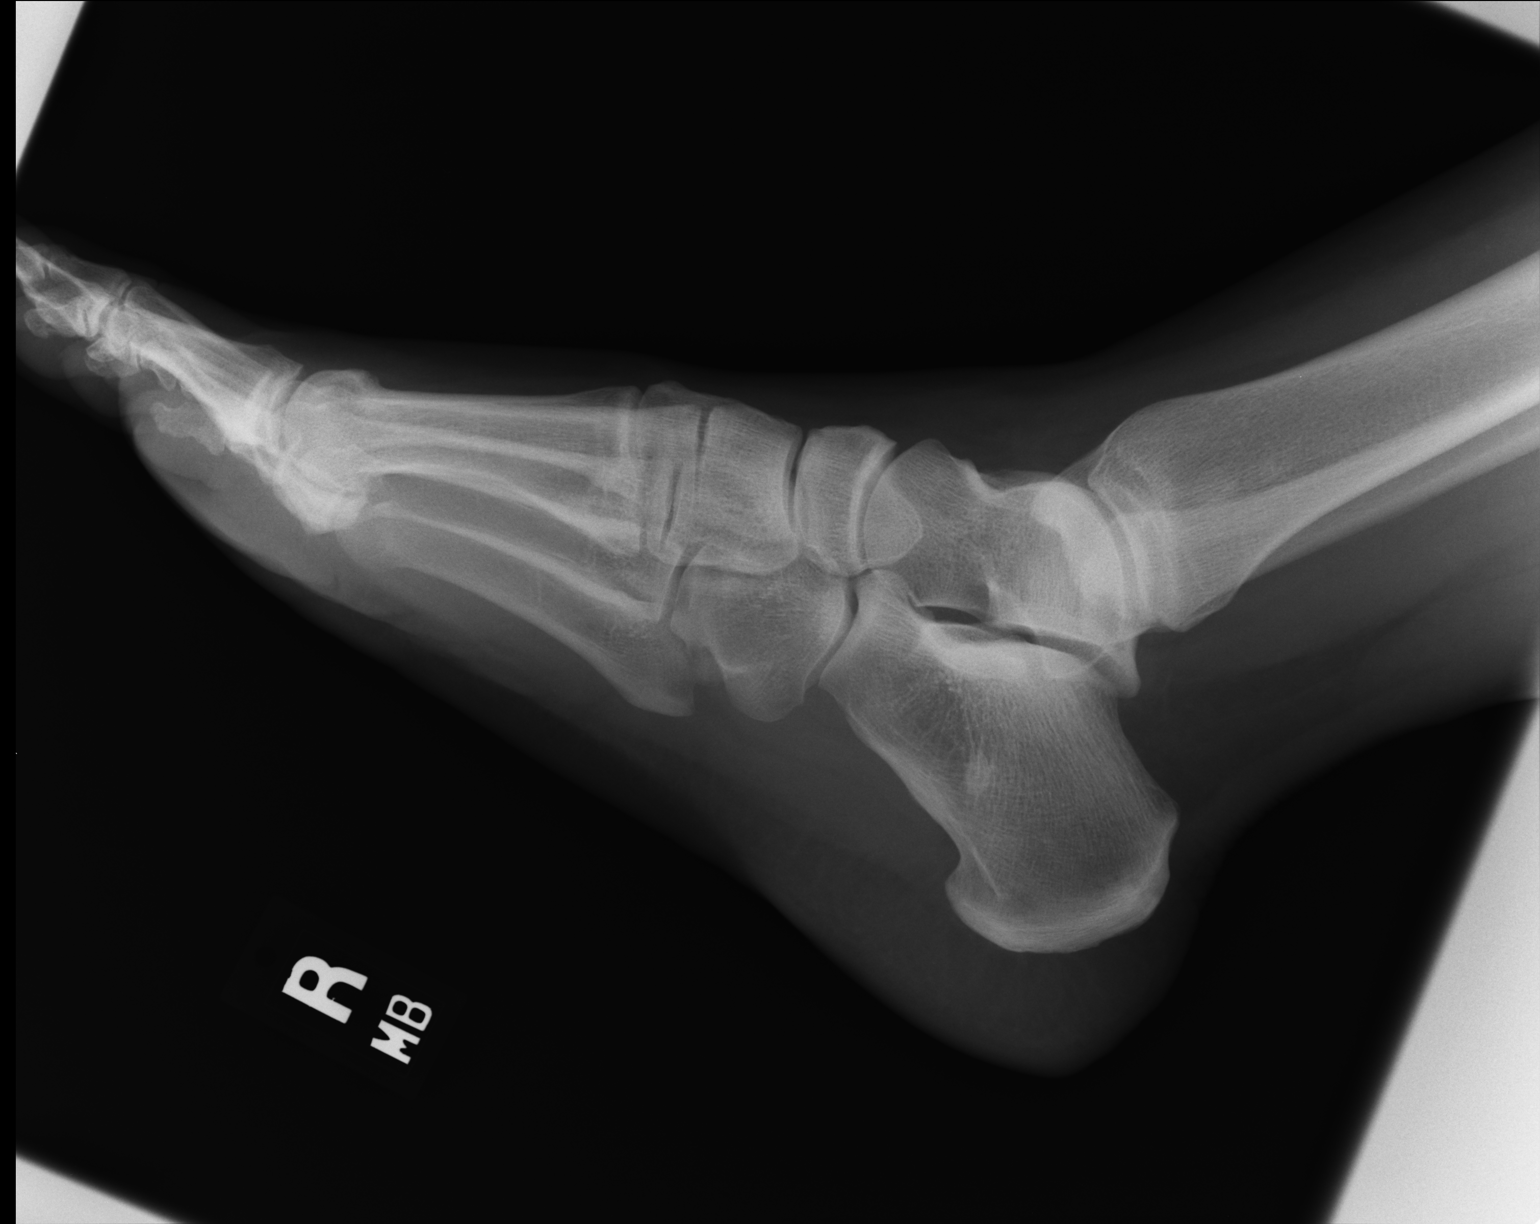

[3 of 3 positions shown; findings below may reference images not displayed]

FINDINGS: No fracture  or dislocation of midfoot or forefoot.  The
phalanges are normal.  The calcaneus is normal.  No soft tissue
abnormality.
IMPRESSION: No acute osseous abnormality.

Clinically significant discrepancy from primary report, if
provided: None

## 2015-06-23 ENCOUNTER — Ambulatory Visit (INDEPENDENT_AMBULATORY_CARE_PROVIDER_SITE_OTHER): Payer: 59 | Admitting: Emergency Medicine

## 2015-06-23 VITALS — BP 122/80 | HR 89 | Temp 98.0°F | Resp 16 | Ht 66.0 in | Wt 213.0 lb

## 2015-06-23 DIAGNOSIS — M5441 Lumbago with sciatica, right side: Secondary | ICD-10-CM | POA: Diagnosis not present

## 2015-06-23 DIAGNOSIS — M5442 Lumbago with sciatica, left side: Secondary | ICD-10-CM | POA: Diagnosis not present

## 2015-06-23 DIAGNOSIS — M5430 Sciatica, unspecified side: Secondary | ICD-10-CM

## 2015-06-23 MED ORDER — HYDROCODONE-ACETAMINOPHEN 5-325 MG PO TABS: 1.0000 | ORAL_TABLET | ORAL | Status: DC | PRN

## 2015-06-23 MED ORDER — CYCLOBENZAPRINE HCL 10 MG PO TABS: 10.0000 mg | ORAL_TABLET | Freq: Three times a day (TID) | ORAL | Status: DC | PRN

## 2015-06-23 MED ORDER — NAPROXEN SODIUM 550 MG PO TABS: 550.0000 mg | ORAL_TABLET | Freq: Two times a day (BID) | ORAL | Status: AC

## 2015-06-23 NOTE — Patient Instructions (Signed)

## 2015-06-23 NOTE — Progress Notes (Signed)
Subjective:  Patient ID: [name], female    DOB: 1971/09/08  Age: 44 y.o. MRN: [id number]  CC: Back Pain   HPI [name] presents  with recurrence of her back pain. She has known  disc disease documented by MRI in the past. She's not had any pain in her back or radicular symptoms for several years. Now over the weekend she tried to do a flip and landed on her stomach he has pain in her low back radiating down with a burning sensation in both eyes. She has no numbness tingling or weakness. No pain really radiating down her legs. She has pain increase in her back when she sits down but is better when she stands or walks. Her lays down he is taking Tylenol for pain with no improvement  History Meckenzie has a past medical history of No pertinent past medical history; Fibroids; DUB (dysfunctional uterine bleeding); Smoking history (07/12/11); Anemia; Allergy; and Hypertension.   She has past surgical history that includes Myomectomy (2003); Cesarean section (2006, 2008); Tubal ligation; and Abdominal hysterectomy.   Her  family history includes Cancer in her father; Heart disease in her mother.  She   reports that she has quit smoking. She has never used smokeless tobacco. She reports that she does not drink alcohol or use illicit drugs.  Outpatient Prescriptions Prior to Visit  Medication Sig Dispense Refill  . HYDROcodone-acetaminophen (NORCO) 5-325 MG per tablet Take 1-2 tablets by mouth every 4 (four) hours as needed for pain. (Patient not taking: Reported on 06/23/2015) 30 tablet 0   No facility-administered medications prior to visit.    Social History   Social History  . Marital Status: Married    Spouse Name: N/A  . Number of Children: N/A  . Years of Education: N/A   Social History Main Topics  . Smoking status: Former Research scientist (life sciences)  . Smokeless tobacco: Never Used  . Alcohol Use: No  . Drug Use: No  . Sexual Activity: Yes   Other Topics Concern  . None    Social History Narrative     Review of Systems  Constitutional: Negative for fever, chills and appetite change.  HENT: Negative for congestion, ear pain, postnasal drip, sinus pressure and sore throat.   Eyes: Negative for pain and redness.  Respiratory: Negative for cough, shortness of breath and wheezing.   Cardiovascular: Negative for leg swelling.  Gastrointestinal: Negative for nausea, vomiting, abdominal pain, diarrhea, constipation and blood in stool.  Endocrine: Negative for polyuria.  Genitourinary: Negative for dysuria, urgency, frequency and flank pain.  Musculoskeletal: Positive for back pain. Negative for gait problem.  Skin: Negative for rash.  Neurological: Negative for weakness and headaches.  Psychiatric/Behavioral: Negative for confusion and decreased concentration. The patient is not nervous/anxious.     Objective:  BP 122/80 mmHg  Pulse 89  Temp(Src) 98 F (36.7 C) (Oral)  Resp 16  Ht 5\' 6"  (1.676 m)  Wt 213 lb (96.616 kg)  BMI 34.40 kg/m2  SpO2 99%  LMP 06/13/2011  Physical Exam  Constitutional: She is oriented to person, place, and time. She appears well-developed and well-nourished.  HENT:  Head: Normocephalic and atraumatic.  Eyes: Conjunctivae are normal. Pupils are equal, round, and reactive to light.  Pulmonary/Chest: Effort normal.  Musculoskeletal: She exhibits no edema.  Neurological: She is alert and oriented to person, place, and time.  Skin: Skin is dry.  Psychiatric: She has a normal mood and affect. Her behavior is normal. Thought  content normal.      Assessment & Plan:   Maysie was seen today for back pain.  Diagnoses and all orders for this visit:  Sciatic neuritis, unspecified laterality  Bilateral low back pain with sciatica, sciatica laterality unspecified  Other orders -     cyclobenzaprine (FLEXERIL) 10 MG tablet; Take 1 tablet (10 mg total) by mouth 3 (three) times daily as needed for muscle spasms. -      naproxen sodium (ANAPROX DS) 550 MG tablet; Take 1 tablet (550 mg total) by mouth 2 (two) times daily with a meal. -     HYDROcodone-acetaminophen (NORCO) 5-325 MG per tablet; Take 1-2 tablets by mouth every 4 (four) hours as needed.   I have discontinued Ms. Selway's HYDROcodone-acetaminophen. I am also having her start on cyclobenzaprine, naproxen sodium, and HYDROcodone-acetaminophen.  Meds ordered this encounter  Medications  . cyclobenzaprine (FLEXERIL) 10 MG tablet    Sig: Take 1 tablet (10 mg total) by mouth 3 (three) times daily as needed for muscle spasms.    Dispense:  30 tablet    Refill:  0  . naproxen sodium (ANAPROX DS) 550 MG tablet    Sig: Take 1 tablet (550 mg total) by mouth 2 (two) times daily with a meal.    Dispense:  40 tablet    Refill:  0  . HYDROcodone-acetaminophen (NORCO) 5-325 MG per tablet    Sig: Take 1-2 tablets by mouth every 4 (four) hours as needed.    Dispense:  30 tablet    Refill:  0    Appropriate red flag conditions were discussed with the patient as well as actions that should be taken.  Patient expressed his understanding.  Follow-up: Return if symptoms worsen or fail to improve.  Roselee Culver, MD

## 2015-08-08 ENCOUNTER — Ambulatory Visit (INDEPENDENT_AMBULATORY_CARE_PROVIDER_SITE_OTHER): Payer: 59 | Admitting: Physician Assistant

## 2015-08-08 VITALS — BP 116/77 | HR 91 | Temp 97.5°F | Resp 20 | Ht 68.0 in | Wt 216.2 lb

## 2015-08-08 DIAGNOSIS — R05 Cough: Secondary | ICD-10-CM

## 2015-08-08 DIAGNOSIS — R059 Cough, unspecified: Secondary | ICD-10-CM

## 2015-08-08 MED ORDER — HYDROCOD POLST-CPM POLST ER 10-8 MG/5ML PO SUER
5.0000 mL | Freq: Every evening | ORAL | Status: DC | PRN
Start: 2015-08-08 — End: 2015-10-06

## 2015-08-08 NOTE — Patient Instructions (Signed)
Viral Infections °A viral infection can be caused by different types of viruses. Most viral infections are not serious and resolve on their own. However, some infections may cause severe symptoms and may lead to further complications. °SYMPTOMS °Viruses can frequently cause: °· Minor sore throat. °· Aches and pains. °· Headaches. °· Runny nose. °· Different types of rashes. °· Watery eyes. °· Tiredness. °· Cough. °· Loss of appetite. °· Gastrointestinal infections, resulting in nausea, vomiting, and diarrhea. °These symptoms do not respond to antibiotics because the infection is not caused by bacteria. However, you might catch a bacterial infection following the viral infection. This is sometimes called a "superinfection." Symptoms of such a bacterial infection may include: °· Worsening sore throat with pus and difficulty swallowing. °· Swollen neck glands. °· Chills and a high or persistent fever. °· Severe headache. °· Tenderness over the sinuses. °· Persistent overall ill feeling (malaise), muscle aches, and tiredness (fatigue). °· Persistent cough. °· Yellow, green, or brown mucus production with coughing. °HOME CARE INSTRUCTIONS  °· Only take over-the-counter or prescription medicines for pain, discomfort, diarrhea, or fever as directed by your caregiver. °· Drink enough water and fluids to keep your urine clear or pale yellow. Sports drinks can provide valuable electrolytes, sugars, and hydration. °· Get plenty of rest and maintain proper nutrition. Soups and broths with crackers or rice are fine. °SEEK IMMEDIATE MEDICAL CARE IF:  °· You have severe headaches, shortness of breath, chest pain, neck pain, or an unusual rash. °· You have uncontrolled vomiting, diarrhea, or you are unable to keep down fluids. °· You or your child has an oral temperature above 102° F (38.9° C), not controlled by medicine. °· Your baby is older than 3 months with a rectal temperature of 102° F (38.9° C) or higher. °· Your baby is 3  months old or younger with a rectal temperature of 100.4° F (38° C) or higher. °MAKE SURE YOU:  °· Understand these instructions. °· Will watch your condition. °· Will get help right away if you are not doing well or get worse. °  °This information is not intended to replace advice given to you by your health care provider. Make sure you discuss any questions you have with your health care provider. °  °Document Released: 07/13/2005 Document Revised: 12/26/2011 Document Reviewed: 03/11/2015 °Elsevier Interactive Patient Education ©2016 [employer]vier Inc[employer] ° °

## 2015-08-09 ENCOUNTER — Encounter: Payer: Self-pay | Admitting: Physician Assistant

## 2015-08-09 NOTE — Progress Notes (Signed)
[facility] [address], [place] [address] [phone]  Date:  08/08/2015   Name:  [name]   DOB:  04-23-1971   MRN:  [id number]  PCP:  Robyn Haber, MD    History of Present Illness:  [name] is a 44 y.o. female patient who presents to [facility] for chief complaint of cough for 4 days.  It is a hard cough with a clear productive sputum.  Began with the cough, sore throat and congestion--which the two latter have resolved.  Cough appears to worsen by nighttime.  She has no fever, body aches, or chills, but does have fatigue.  No shortness of breath or dyspnea.    She is hydrating, and taking mucinex, but feels that this overdries her.  Today, she feels like the cough is resolving, but was concerned that it "was settling" in her chest today.     Patient Active Problem List   Diagnosis Date Noted  . Anemia 03/26/2013  . Smoking history 07/12/2011  . 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 No pertinent past medical history   . Fibroids   . DUB (dysfunctional uterine bleeding)   . Smoking history 07/12/11    no hx of  smoking  . Anemia   . Allergy   . Hypertension     Past Surgical History  Procedure Laterality Date  . Myomectomy  2003  . Cesarean section  2006, 2008    x 2  . Tubal ligation    . Abdominal hysterectomy      Social History  Substance Use Topics  . Smoking status: Former Research scientist (life sciences)  . Smokeless tobacco: Never Used  . Alcohol Use: No    Family History  Problem Relation Age of Onset  . Heart disease Mother     angina  . Cancer Father     prostate    No Known Allergies  Medication list has been reviewed and updated.  Current Outpatient Prescriptions on File Prior to Visit  Medication Sig Dispense Refill  . cyclobenzaprine (FLEXERIL) 10 MG tablet Take 1 tablet (10 mg total) by mouth 3 (three) times daily as needed for muscle spasms. (Patient not taking:  Reported on 08/08/2015) 30 tablet 0  . HYDROcodone-acetaminophen (NORCO) 5-325 MG per tablet Take 1-2 tablets by mouth every 4 (four) hours as needed. (Patient not taking: Reported on 08/08/2015) 30 tablet 0  . naproxen sodium (ANAPROX DS) 550 MG tablet Take 1 tablet (550 mg total) by mouth 2 (two) times daily with a meal. (Patient not taking: Reported on 08/08/2015) 40 tablet 0   No current facility-administered medications on file prior to visit.    ROS ROS otherwise unremarkable unless listed above.   Physical Examination: BP 116/77 mmHg  Pulse 91  Temp(Src) 97.5 F (36.4 C) (Oral)  Resp 20  Ht 5\' 8"  (1.727 m)  Wt 216 lb 3.2 oz (98.068 kg)  BMI 32.88 kg/m2  SpO2 98%  LMP 06/13/2011 Ideal Body Weight: Weight in (lb) to have BMI = 25: 164.1  Physical Exam  Constitutional: She is oriented to person, place, and time. She appears well-developed and well-nourished. No distress.  HENT:  Head: Normocephalic and atraumatic.  Right Ear: Tympanic membrane, external ear and ear canal normal.  Left Ear: Tympanic membrane, external ear and ear canal normal.  Nose: Mucosal edema present. No rhinorrhea. Right sinus exhibits no maxillary sinus tenderness and no frontal sinus  tenderness. Left sinus exhibits no maxillary sinus tenderness and no frontal sinus tenderness.  Mouth/Throat: No uvula swelling. No oropharyngeal exudate, posterior oropharyngeal edema or posterior oropharyngeal erythema.  Eyes: Conjunctivae and EOM are normal. Pupils are equal, round, and reactive to light.  Cardiovascular: Normal rate and regular rhythm.  Exam reveals no gallop, no distant heart sounds and no friction rub.   No murmur heard. Pulses:      Radial pulses are 2+ on the right side, and 2+ on the left side.  Pulmonary/Chest: Effort normal. No respiratory distress. She has no decreased breath sounds. She has no wheezes. She has no rhonchi.  Lymphadenopathy:       Head (right side): No submandibular, no  tonsillar, no preauricular and no posterior auricular adenopathy present.       Head (left side): No submandibular, no tonsillar, no preauricular and no posterior auricular adenopathy present.    She has no cervical adenopathy.  Neurological: She is alert and oriented to person, place, and time.  Skin: Skin is warm, dry and intact. She is not diaphoretic.  Psychiatric: She has a normal mood and affect. Her behavior is normal.     Assessment and Plan: MIRAY MANCINO is a 44 y.o. female who is here today for cough of 4 days.  Appears to be resolving at this time.  Treating supportively at this time.  Advised to continue the mucinex.  Tussionex for nighttime cough. Call if no improvement of sxs after 3 days.  May issue abx at that time.     1. Cough - chlorpheniramine-HYDROcodone (TUSSIONEX PENNKINETIC ER) 10-8 MG/5ML SUER; Take 5 mLs by mouth at bedtime as needed for cough.  Dispense: 80 mL; Refill: 0   Ivar Drape, PA-C [facility] 08/09/2015 10:57 AM

## 2015-08-09 NOTE — Progress Notes (Signed)
  Medical screening examination/treatment/procedure(s) were performed by non-physician practitioner and as supervising physician I was immediately available for consultation/collaboration.     

## 2015-10-06 ENCOUNTER — Encounter: Payer: Self-pay | Admitting: Physician Assistant

## 2015-10-06 ENCOUNTER — Ambulatory Visit (INDEPENDENT_AMBULATORY_CARE_PROVIDER_SITE_OTHER): Payer: 59 | Admitting: Physician Assistant

## 2015-10-06 VITALS — BP 134/90 | HR 79 | Temp 98.8°F | Resp 16 | Ht 65.5 in | Wt 215.0 lb

## 2015-10-06 DIAGNOSIS — Z13228 Encounter for screening for other metabolic disorders: Secondary | ICD-10-CM | POA: Diagnosis not present

## 2015-10-06 DIAGNOSIS — IMO0001 Reserved for inherently not codable concepts without codable children: Secondary | ICD-10-CM

## 2015-10-06 DIAGNOSIS — Z130 Encounter for screening for diseases of the blood and blood-forming organs and certain disorders involving the immune mechanism: Secondary | ICD-10-CM | POA: Diagnosis not present

## 2015-10-06 DIAGNOSIS — Z0000 Encounter for general adult medical examination without abnormal findings: Secondary | ICD-10-CM | POA: Diagnosis not present

## 2015-10-06 DIAGNOSIS — R030 Elevated blood-pressure reading, without diagnosis of hypertension: Secondary | ICD-10-CM

## 2015-10-06 DIAGNOSIS — Z13220 Encounter for screening for lipoid disorders: Secondary | ICD-10-CM | POA: Diagnosis not present

## 2015-10-06 DIAGNOSIS — R079 Chest pain, unspecified: Secondary | ICD-10-CM | POA: Diagnosis not present

## 2015-10-06 DIAGNOSIS — Z1329 Encounter for screening for other suspected endocrine disorder: Secondary | ICD-10-CM | POA: Diagnosis not present

## 2015-10-06 NOTE — Progress Notes (Signed)
[facility] [address], [place] [address] [phone]  Date:  10/06/2015   Name:  [name]   DOB:  [birth year]   MRN:  [id number]  PCP:  Robyn Haber, MD    History of Present Illness:  TANELL WITTSTRUCK is a 44 y.o. female patient who presents to [facility] for annual physical exam and recent hx of chest pain.   She had some chest pains 10 days ago.  She was running up and down the stairs, and developped a chest pains, that lasted about 7 minutes.  Sat down and the pain slowly resolved.  No pain in arm or face.  No diaphoresis, sob, nausea, or dizziness.  She took nothing for relief.  This pain never returned.  She has a hx of chest pains.  No hx of CAD.  She has had no fatigue.  She denies heartburn symptoms.   She has a concern of elevated bp readings.  She went to [facility] 180/90.  She went home, and the bp was 155/90.  155-135. No hx of thyroid disease.    Sleeping: no trouble with sleeping.  She has noticed increased agitation.  Snores loudly  No hx of hotflashes.  Partial hysterectomy. 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  Acknowledges that transition as stressful.   Exercise: None at this time, but plans to start.  Diet:  M-Th no white no carbs or starches.  Friday Sunday--eat anything.  Water intake: 70 oz per day.  No sodas or teas.  No caffeine.  BM: Regular.  No blood in the stool.  No constipation or diarrhea.  Urination: normal, no frequency, hematuria, or dysuria.  Social activity: nothing at this time.  8, 10.    etOH use: occasional social  Tobacco use: none-- Illicit drug use: none   Patient Active Problem List   Diagnosis Date Noted  . Anemia 03/26/2013  . Smoking history 07/12/2011  . 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 No pertinent past medical history   . Fibroids   . DUB (dysfunctional uterine bleeding)   . Smoking history 07/12/11    no hx  of  smoking  . Anemia   . Allergy   . Hypertension   . Vertigo     Past Surgical History  Procedure Laterality Date  . Myomectomy  2003  . Cesarean section  2006, 2008    x 2  . Tubal ligation    . Abdominal hysterectomy      Social History  Substance Use Topics  . Smoking status: Former Research scientist (life sciences)  . Smokeless tobacco: Never Used  . Alcohol Use: 0.0 oz/week    0 Standard drinks or equivalent per week     Comment: occas    Family History  Problem Relation Age of Onset  . Heart disease Mother     angina  . Hypertension Mother   . Cancer Father     prostate  . Diabetes Father   . Heart disease Maternal Grandmother   . Diabetes Maternal Grandfather   . Diabetes Paternal Grandfather     No Known Allergies  Medication list has been reviewed and updated.  Current Outpatient Prescriptions on File Prior to Visit  Medication Sig Dispense Refill  . cyclobenzaprine (FLEXERIL) 10 MG tablet Take 1 tablet (10 mg total) by mouth 3 (three) times daily as needed for muscle spasms. (Patient not taking: Reported on 08/08/2015)  30 tablet 0  . HYDROcodone-acetaminophen (NORCO) 5-325 MG per tablet Take 1-2 tablets by mouth every 4 (four) hours as needed. (Patient not taking: Reported on 08/08/2015) 30 tablet 0  . naproxen sodium (ANAPROX DS) 550 MG tablet Take 1 tablet (550 mg total) by mouth 2 (two) times daily with a meal. (Patient not taking: Reported on 08/08/2015) 40 tablet 0   No current facility-administered medications on file prior to visit.    Review of Systems  Constitutional: Negative for fever and chills.  HENT: Negative for ear discharge, ear pain and sore throat.   Eyes: Negative for blurred vision and double vision.  Respiratory: Negative for cough, shortness of breath and wheezing.   Cardiovascular: Negative for chest pain, palpitations and leg swelling.  Gastrointestinal: Negative for nausea, vomiting and diarrhea.  Genitourinary: Negative for dysuria, frequency and  hematuria.  Skin: Negative for itching and rash.  Neurological: Negative for dizziness and headaches.     Physical Examination: BP 134/90 mmHg  Pulse 79  Temp(Src) 98.8 F (37.1 C) (Oral)  Resp 16  Ht 5' 5.5" (1.664 m)  Wt 215 lb (97.523 kg)  BMI 35.22 kg/m2  SpO2 99%  LMP 06/13/2011 Ideal Body Weight: Weight in (lb) to have BMI = 25: 152.2  Physical Exam  Constitutional: She is oriented to person, place, and time. She appears well-developed and well-nourished. No distress.  HENT:  Head: Normocephalic and atraumatic.  Right Ear: Tympanic membrane, external ear and ear canal normal.  Left Ear: Tympanic membrane, external ear and ear canal normal.  Nose: Right sinus exhibits no maxillary sinus tenderness and no frontal sinus tenderness. Left sinus exhibits no maxillary sinus tenderness and no frontal sinus tenderness.  Mouth/Throat: Oropharynx is clear and moist. No uvula swelling. No oropharyngeal exudate, posterior oropharyngeal edema or posterior oropharyngeal erythema.  Eyes: Conjunctivae and EOM are normal. Pupils are equal, round, and reactive to light.  Neck: Normal range of motion. Neck supple. No thyromegaly present.  Cardiovascular: Normal rate, regular rhythm, normal heart sounds and intact distal pulses.  Exam reveals no gallop, no distant heart sounds and no friction rub.   No murmur heard. Pulmonary/Chest: Effort normal and breath sounds normal. No respiratory distress. She has no decreased breath sounds. She has no wheezes. She has no rhonchi.  Abdominal: Soft. Bowel sounds are normal. She exhibits no distension and no mass. There is no tenderness.  Musculoskeletal: Normal range of motion. She exhibits no edema or tenderness.  Lymphadenopathy:       Head (right side): No submandibular, no tonsillar, no preauricular and no posterior auricular adenopathy present.       Head (left side): No submandibular, no tonsillar, no preauricular and no posterior auricular  adenopathy present.    She has no cervical adenopathy.  Neurological: She is alert and oriented to person, place, and time. No cranial nerve deficit. She exhibits normal muscle tone. Coordination normal.  Skin: Skin is warm and dry. She is not diaphoretic.  Psychiatric: She has a normal mood and affect. Her behavior is normal.   EKG: normal  Assessment and Plan: YULIA BOSSARD is a 44 y.o. female who is here today for annual physical exam and concern of chest pains. -discussed starting an anti-hypertensive at this time.  Recommended a ccb.  Patient is currently declining. Addendum: patient contacted stating she would start bp medicine.  Started on amlodipine 5mg .  Advised to return in 4 weeks for follow up.   Placed future order for general lab  work where she can fast for lipid panel.  Patient will return for this. Due to cp with exertion--cardiology consult is warranted at this time, though all appears normal at this time via physical exam.  i am advising that she refrain from heavy exeritonal exercise as we had discussed until she be cleared by cardiology.  Patient voiced understanding. Given dash diet to follow  Annual physical exam - Plan: EKG 12-Lead, CBC, TSH, Lipid panel, COMPLETE METABOLIC PANEL WITH GFR, CBC, TSH, Lipid panel, COMPLETE METABOLIC PANEL WITH GFR, CANCELED: CBC, CANCELED: TSH, CANCELED: Lipid panel, CANCELED: COMPLETE METABOLIC PANEL WITH GFR, CANCELED: POCT urinalysis dipstick, CANCELED: POCT Microscopic Urinalysis ([facility]), CANCELED: CBC, CANCELED: COMPLETE METABOLIC PANEL WITH GFR, CANCELED: Lipid panel, CANCELED: TSH  Screening for deficiency anemia - Plan: CBC, CBC, CANCELED: CBC, CANCELED: CBC  Screening for thyroid disorder - Plan: TSH, TSH, CANCELED: TSH, CANCELED: TSH  Screening for metabolic disorder - Plan: COMPLETE METABOLIC PANEL WITH GFR, COMPLETE METABOLIC PANEL WITH GFR, CANCELED: COMPLETE METABOLIC PANEL WITH GFR, CANCELED: POCT urinalysis dipstick,  CANCELED: POCT Microscopic Urinalysis ([facility]), CANCELED: COMPLETE METABOLIC PANEL WITH GFR  Elevated blood pressure - Plan: EKG 12-Lead, TSH, Lipid panel, COMPLETE METABOLIC PANEL WITH GFR, TSH, Lipid panel, COMPLETE METABOLIC PANEL WITH GFR, CANCELED: POCT urinalysis dipstick, CANCELED: POCT Microscopic Urinalysis ([facility]), CANCELED: Ambulatory referral to Cardiology  Screening for lipid disorders - Plan: Lipid panel, Lipid panel, CANCELED: Lipid panel, CANCELED: Lipid panel  Chest pain, unspecified chest pain type - Plan: EKG 12-Lead, CBC, TSH, Lipid panel, COMPLETE METABOLIC PANEL WITH GFR, CBC, TSH, Lipid panel, COMPLETE METABOLIC PANEL WITH GFR, CANCELED: Ambulatory referral to Cardiology, CANCELED: CBC, CANCELED: COMPLETE METABOLIC PANEL WITH GFR, CANCELED: TSH   Ivar Drape, PA-C [facility] 10/06/2015 2:40 PM

## 2015-10-06 NOTE — Patient Instructions (Addendum)
Please return for your blood work within this week. I would like you to refrain from exertional activity at this time, until you can have the cardiology consult. Pending his results, we can then proceed to exercise.    Keeping You Healthy  Get These Tests 1. Blood Pressure- Have your blood pressure checked once a year by your health care provider.  Normal blood pressure is 120/80. 2. Weight- Have your body mass index (BMI) calculated to screen for obesity.  BMI is measure of body fat based on height and weight.  You can also calculate your own BMI at GravelBags.it. 3. Cholesterol- Have your cholesterol checked every 5 years starting at age 47 then yearly starting at age 74. 29. Chlamydia, HIV, and other sexually transmitted diseases- Get screened every year until age 73, then within three months of each new sexual provider. 5. Pap Test - Every 1-5 years; discuss with your health care provider. 6. Mammogram- Every 1-2 years starting at age 54--50  Take these medicines  Calcium with Vitamin D-Your body needs 1200 mg of Calcium each day and [id number] IU of Vitamin D daily.  Your body can only absorb 500 mg of Calcium at a time so Calcium must be taken in 2 or 3 divided doses throughout the day.  Multivitamin with folic acid- Once daily if it is possible for you to become pregnant.  Get these Immunizations  Gardasil-Series of three doses; prevents HPV related illness such as genital warts and cervical cancer.  Menactra-Single dose; prevents meningitis.  Tetanus shot- Every 10 years.  Flu shot-Every year.  Take these steps 1. Do not smoke-Your healthcare provider can help you quit.  For tips on how to quit go to www.smokefree.gov or call 1-800 QUITNOW. 2. Be physically active- Exercise 5 days a week for at least 30 minutes.  If you are not already physically active, start slow and gradually work up to 30 minutes of moderate physical activity.  Examples of moderate activity  include walking briskly, dancing, swimming, bicycling, etc. 3. Breast Cancer- A self breast exam every month is important for early detection of breast cancer.  For more information and instruction on self breast exams, ask your healthcare provider or https://www.patel.info/. 4. Eat a healthy diet- Eat a variety of healthy foods such as fruits, vegetables, whole grains, low fat milk, low fat cheeses, yogurt, lean meats, poultry and fish, beans, nuts, tofu, etc.  For more information go to www. Thenutritionsource.org 5. Drink alcohol in moderation- Limit alcohol intake to one drink or less per day. Never drink and drive. 6. Depression- Your emotional health is as important as your physical health.  If you're feeling down or losing interest in things you normally enjoy please talk to your healthcare provider about being screened for depression. 7. Dental visit- Brush and floss your teeth twice daily; visit your dentist twice a year. 8. Eye doctor- Get an eye exam at least every 2 years. 9. Helmet use- Always wear a helmet when riding a bicycle, motorcycle, rollerblading or skateboarding. 26. Safe sex- If you may be exposed to sexually transmitted infections, use a condom. 11. Seat belts- Seat belts can save your live; always wear one. 12. Smoke/Carbon Monoxide detectors- These detectors need to be installed on the appropriate level of your home. Replace batteries at least once a year. 13. Skin cancer- When out in the sun please cover up and use sunscreen 15 SPF or higher. 14. Violence- If anyone is threatening or hurting you, please tell your  healthcare provider. DASH Eating Plan DASH stands for "Dietary Approaches to Stop Hypertension." The DASH eating plan is a healthy eating plan that has been shown to reduce high blood pressure (hypertension). Additional health benefits may include reducing the risk of type 2 diabetes mellitus, heart disease, and stroke. The DASH eating plan may  also help with weight loss. WHAT DO I NEED TO KNOW ABOUT THE DASH EATING PLAN? For the DASH eating plan, you will follow these general guidelines:  Choose foods with a percent daily value for sodium of less than 5% (as listed on the food label).  Use salt-free seasonings or herbs instead of table salt or sea salt.  Check with your health care provider or pharmacist before using salt substitutes.  Eat lower-sodium products, often labeled as "lower sodium" or "no salt added."  Eat fresh foods.  Eat more vegetables, fruits, and low-fat dairy products.  Choose whole grains. Look for the word "whole" as the first word in the ingredient list.  Choose fish and skinless chicken or [place] more often than red meat. Limit fish, poultry, and meat to 6 oz (170 g) each day.  Limit sweets, desserts, sugars, and sugary drinks.  Choose heart-healthy fats.  Limit cheese to 1 oz (28 g) per day.  Eat more home-cooked food and less restaurant, buffet, and fast food.  Limit fried foods.  Cook foods using methods other than frying.  Limit canned vegetables. If you do use them, rinse them well to decrease the sodium.  When eating at a restaurant, ask that your food be prepared with less salt, or no salt if possible. WHAT FOODS CAN I EAT? Seek help from a dietitian for individual calorie needs. Grains Whole grain or whole wheat bread. Brown rice. Whole grain or whole wheat pasta. Quinoa, bulgur, and whole grain cereals. Low-sodium cereals. Corn or whole wheat flour tortillas. Whole grain cornbread. Whole grain crackers. Low-sodium crackers. Vegetables Fresh or frozen vegetables (raw, steamed, roasted, or grilled). Low-sodium or reduced-sodium tomato and vegetable juices. Low-sodium or reduced-sodium tomato sauce and paste. Low-sodium or reduced-sodium canned vegetables.  Fruits All fresh, canned (in natural juice), or frozen fruits. Meat and Other Protein Products Ground beef (85% or leaner),  grass-fed beef, or beef trimmed of fat. Skinless chicken or [place]. Ground chicken or [place]. Pork trimmed of fat. All fish and seafood. Eggs. Dried beans, peas, or lentils. Unsalted nuts and seeds. Unsalted canned beans. Dairy Low-fat dairy products, such as skim or 1% milk, 2% or reduced-fat cheeses, low-fat ricotta or cottage cheese, or plain low-fat yogurt. Low-sodium or reduced-sodium cheeses. Fats and Oils Tub margarines without trans fats. Light or reduced-fat mayonnaise and salad dressings (reduced sodium). Avocado. Safflower, olive, or canola oils. Natural peanut or almond butter. Other Unsalted popcorn and pretzels. The items listed above may not be a complete list of recommended foods or beverages. Contact your dietitian for more options. WHAT FOODS ARE NOT RECOMMENDED? Grains White bread. White pasta. White rice. Refined cornbread. Bagels and croissants. Crackers that contain trans fat. Vegetables Creamed or fried vegetables. Vegetables in a cheese sauce. Regular canned vegetables. Regular canned tomato sauce and paste. Regular tomato and vegetable juices. Fruits Dried fruits. Canned fruit in light or heavy syrup. Fruit juice. Meat and Other Protein Products Fatty cuts of meat. Ribs, chicken wings, bacon, sausage, bologna, salami, chitterlings, fatback, hot dogs, bratwurst, and packaged luncheon meats. Salted nuts and seeds. Canned beans with salt. Dairy Whole or 2% milk, cream, half-and-half, and cream cheese. Whole-fat or  sweetened yogurt. Full-fat cheeses or blue cheese. Nondairy creamers and whipped toppings. Processed cheese, cheese spreads, or cheese curds. Condiments Onion and garlic salt, seasoned salt, table salt, and sea salt. Canned and packaged gravies. Worcestershire sauce. Tartar sauce. Barbecue sauce. Teriyaki sauce. Soy sauce, including reduced sodium. Steak sauce. Fish sauce. Oyster sauce. Cocktail sauce. Horseradish. Ketchup and mustard. Meat flavorings and  tenderizers. Bouillon cubes. Hot sauce. Tabasco sauce. Marinades. Taco seasonings. Relishes. Fats and Oils Butter, stick margarine, lard, shortening, ghee, and bacon fat. Coconut, palm kernel, or palm oils. Regular salad dressings. Other Pickles and olives. Salted popcorn and pretzels. The items listed above may not be a complete list of foods and beverages to avoid. Contact your dietitian for more information. WHERE CAN I FIND MORE INFORMATION? National Heart, Lung, and Blood Institute: travelstabloid.com   This information is not intended to replace advice given to you by your health care provider. Make sure you discuss any questions you have with your health care provider.   Document Released: 09/22/2011 Document Revised: 10/24/2014 Document Reviewed: 08/07/2013 Elsevier Interactive Patient Education [employer].

## 2015-10-06 NOTE — Progress Notes (Signed)
   Subjective:    Patient ID: [name], female    DOB: December 19, 1970, 44 y.o.   MRN: [id number]  HPI    Review of Systems  Constitutional: Negative.   HENT: Negative.   Eyes: Negative.   Respiratory: Negative.   Cardiovascular: Positive for chest pain.  Gastrointestinal: Negative.   Endocrine: Negative.   Genitourinary: Negative.   Musculoskeletal: Negative.   Skin: Negative.   Allergic/Immunologic: Positive for environmental allergies.  Neurological: Negative.   Hematological: Negative.   Psychiatric/Behavioral: Negative.        Objective:   Physical Exam        Assessment & Plan:

## 2015-10-07 ENCOUNTER — Other Ambulatory Visit: Payer: 59

## 2015-10-07 DIAGNOSIS — R079 Chest pain, unspecified: Secondary | ICD-10-CM | POA: Diagnosis not present

## 2015-10-07 DIAGNOSIS — Z130 Encounter for screening for diseases of the blood and blood-forming organs and certain disorders involving the immune mechanism: Secondary | ICD-10-CM | POA: Diagnosis not present

## 2015-10-07 LAB — CBC
HCT: 35.8 % — ABNORMAL LOW (ref 36.0–46.0)
Hemoglobin: 12 g/dL (ref 12.0–15.0)
MCH: 28.1 pg (ref 26.0–34.0)
MCHC: 33.5 g/dL (ref 30.0–36.0)
MCV: 83.8 fL (ref 78.0–100.0)
MPV: 8.5 fL — ABNORMAL LOW (ref 8.6–12.4)
PLATELETS: 369 10*3/uL (ref 150–400)
RBC: 4.27 MIL/uL (ref 3.87–5.11)
RDW: 13.4 % (ref 11.5–15.5)
WBC: 6 10*3/uL (ref 4.0–10.5)

## 2015-10-07 LAB — COMPLETE METABOLIC PANEL WITH GFR
ALBUMIN: 4.1 g/dL (ref 3.6–5.1)
ALK PHOS: 58 U/L (ref 33–115)
ALT: 19 U/L (ref 6–29)
AST: 20 U/L (ref 10–35)
BILIRUBIN TOTAL: 0.3 mg/dL (ref 0.2–1.2)
BUN: 13 mg/dL (ref 7–25)
CO2: 26 mmol/L (ref 20–31)
CREATININE: 0.66 mg/dL (ref 0.50–1.10)
Calcium: 9.2 mg/dL (ref 8.6–10.2)
Chloride: 101 mmol/L (ref 98–110)
GFR, Est African American: >89 mL/min (ref >=60)
GLUCOSE: 102 mg/dL — AB (ref 65–99)
Potassium: 4.4 mmol/L (ref 3.5–5.3)
SODIUM: 137 mmol/L (ref 135–146)
TOTAL PROTEIN: 7.7 g/dL (ref 6.1–8.1)

## 2015-10-07 LAB — LIPID PANEL
Cholesterol: 190 mg/dL (ref 125–200)
HDL: 57 mg/dL (ref >=46)
LDL Cholesterol: 117 mg/dL (ref <130)
Total CHOL/HDL Ratio: 3.3 Ratio (ref <=5.0)
Triglycerides: 79 mg/dL (ref <150)
VLDL: 16 mg/dL (ref <30)

## 2015-10-07 LAB — TSH: TSH: 1.209 u[IU]/mL (ref 0.350–4.500)

## 2015-10-08 ENCOUNTER — Telehealth: Payer: Self-pay | Admitting: Physician Assistant

## 2015-10-08 DIAGNOSIS — I10 Essential (primary) hypertension: Secondary | ICD-10-CM

## 2015-10-08 MED ORDER — AMLODIPINE BESYLATE 5 MG PO TABS: 5.0000 mg | ORAL_TABLET | Freq: Every day | ORAL | Status: DC

## 2015-10-08 NOTE — Telephone Encounter (Signed)
Please alert that i am starting the amlodipine 5 mg daily.  She can take in the morning.  Let's follow up in 4 weeks with how this bp medicine is doing.

## 2015-10-08 NOTE — Telephone Encounter (Signed)
Patient was seen on 10/06/2015 and states that she discussed a BP med with Colletta [place]. She is would like to start that medication. [facility] [place].  [phone]

## 2015-10-09 NOTE — Telephone Encounter (Signed)
Called pt and advised message from provider on their voicemail.  

## 2015-10-10 ENCOUNTER — Encounter: Payer: Self-pay | Admitting: Physician Assistant

## 2016-04-29 ENCOUNTER — Telehealth: Payer: Self-pay

## 2016-04-29 NOTE — Telephone Encounter (Signed)
Pt is having issues with vertigo and would like to talk with somone   Best number [phone]

## 2016-04-30 NOTE — Telephone Encounter (Signed)
Called and spoke with patient. She states she has been experiencing vertigo with nausea. She has seen Dr. Lorelei Pont in the past for issues with vertigo. She takes mustard as a natural remedy but lately it has not been helping. She would like to know what she should do. Advised pt to come in to be seen. Please advise.

## 2016-04-30 NOTE — Telephone Encounter (Signed)
RTC.  Philis Fendt, MS, PA-C 1:43 PM, 04/30/2016

## 2016-04-30 NOTE — Telephone Encounter (Signed)
RTC. Philis Fendt, MS, PA-C 1:43 PM, 04/30/2016

## 2016-05-02 NOTE — Telephone Encounter (Signed)
Left VM informing pt to RTC,to call back if further questions or to make appt.

## 2016-10-31 ENCOUNTER — Other Ambulatory Visit: Payer: Self-pay | Admitting: Physician Assistant

## 2016-10-31 DIAGNOSIS — I10 Essential (primary) hypertension: Secondary | ICD-10-CM

## 2019-07-25 ENCOUNTER — Other Ambulatory Visit: Payer: Self-pay

## 2019-07-25 DIAGNOSIS — Z20822 Contact with and (suspected) exposure to covid-19: Secondary | ICD-10-CM

## 2019-07-27 LAB — NOVEL CORONAVIRUS, NAA: SARS-CoV-2, NAA: NOT DETECTED

## 2020-01-07 ENCOUNTER — Other Ambulatory Visit: Payer: Self-pay

## 2020-01-07 ENCOUNTER — Encounter (INDEPENDENT_AMBULATORY_CARE_PROVIDER_SITE_OTHER): Payer: Self-pay | Admitting: Family Medicine

## 2020-01-07 ENCOUNTER — Ambulatory Visit (INDEPENDENT_AMBULATORY_CARE_PROVIDER_SITE_OTHER): Payer: PRIVATE HEALTH INSURANCE | Admitting: Family Medicine

## 2020-01-07 VITALS — BP 126/80 | HR 82 | Temp 98.8°F | Ht 65.0 in | Wt 216.0 lb

## 2020-01-07 DIAGNOSIS — Z1331 Encounter for screening for depression: Secondary | ICD-10-CM

## 2020-01-07 DIAGNOSIS — R5383 Other fatigue: Secondary | ICD-10-CM

## 2020-01-07 DIAGNOSIS — R0683 Snoring: Secondary | ICD-10-CM

## 2020-01-07 DIAGNOSIS — E7849 Other hyperlipidemia: Secondary | ICD-10-CM

## 2020-01-07 DIAGNOSIS — R7303 Prediabetes: Secondary | ICD-10-CM

## 2020-01-07 DIAGNOSIS — I10 Essential (primary) hypertension: Secondary | ICD-10-CM | POA: Diagnosis not present

## 2020-01-07 DIAGNOSIS — Z9189 Other specified personal risk factors, not elsewhere classified: Secondary | ICD-10-CM | POA: Diagnosis not present

## 2020-01-07 DIAGNOSIS — Z0289 Encounter for other administrative examinations: Secondary | ICD-10-CM

## 2020-01-07 DIAGNOSIS — R0602 Shortness of breath: Secondary | ICD-10-CM

## 2020-01-07 DIAGNOSIS — E559 Vitamin D deficiency, unspecified: Secondary | ICD-10-CM

## 2020-01-07 DIAGNOSIS — Z6836 Body mass index (BMI) 36.0-36.9, adult: Secondary | ICD-10-CM

## 2020-01-07 NOTE — Progress Notes (Signed)
Chief Complaint:   OBESITY [name] (MR# [id number]) is a 49 y.o. female who presents for evaluation and treatment of obesity and related comorbidities. Current BMI is Body mass index is 35.94 kg/m. [name] has been struggling with her weight for many years and has been unsuccessful in either losing weight, maintaining weight loss, or reaching her healthy weight goal.  [name] is currently in the action stage of change and ready to dedicate time achieving and maintaining a healthier weight. [name] is interested in becoming our patient and working on intensive lifestyle modifications including (but not limited to) diet and exercise for weight loss.  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  She works 40 hours a week at [employer].  She is married with 2 children, ages 66 and 27.  Her spouse also started the program today.  [name]'s habits were reviewed today and are as follows: Her family eats meals together, she thinks her family will eat healthier with her, her desired weight loss is 58 pounds, her heaviest weight ever was 225 pounds, she craves sweets and citrus, she snacks frequently in the evenings, she skips breakfast frequently, she is frequently drinking liquids with calories, she frequently eats larger portions than normal and she struggles with emotional eating.  Depression Screen [name]'s Food and Mood (modified PHQ-9) score was 2.  Depression screen [name] 2/9 01/07/2020  Decreased Interest 0  Down, Depressed, Hopeless 1  PHQ - 2 Score 1  Altered sleeping 0  Tired, decreased energy 0  Change in appetite 1  Feeling bad or failure about yourself  0  Trouble concentrating 0  Moving slowly or fidgety/restless 0  Suicidal thoughts 0  PHQ-9 Score 2  Difficult doing work/chores Not difficult at all   Subjective:   1. Other fatigue [name] admits to daytime somnolence and denies waking up still tired. Patent has a history of symptoms of morning fatigue, morning headache  and snoring. [name] generally gets 8 hours of sleep per night, and states that she has generally restful sleep. Snoring is present. Apneic episodes are not present. Epworth Sleepiness Score is 7.  2. SOB (shortness of breath) on exertion [name] notes increasing shortness of breath with exercising and seems to be worsening over time with weight gain. She notes getting out of breath sooner with activity than she used to. This has gotten worse recently. [name] denies shortness of breath at rest or orthopnea.  3. Essential hypertension Review: taking medications as instructed, no medication side effects noted, no chest pain on exertion, no dyspnea on exertion, no swelling of ankles. She is taking amlodipine for blood pressure.  BP Readings from Last 3 Encounters:  01/07/20 126/80  10/06/15 134/90  08/08/15 116/77   4. Prediabetes [name] has a diagnosis of prediabetes based on her elevated HgA1c and was informed this puts her at greater risk of developing diabetes. She continues to work on diet and exercise to decrease her risk of diabetes. She denies nausea or hypoglycemia.  She takes metformin 500 mg daily.  5. Vitamin D deficiency Possibly overtreated per patient.  6. Other hyperlipidemia [name] has hyperlipidemia and has been trying to improve her cholesterol levels with intensive lifestyle modification including a low saturated fat diet, exercise and weight loss. She denies any chest pain, claudication or myalgias.  Lab Results  Component Value Date   ALT 19 10/07/2015   AST 20 10/07/2015   ALKPHOS 58 10/07/2015   BILITOT 0.3 10/07/2015   Lab Results  Component  Value Date   CHOL 190 10/07/2015   HDL 57 10/07/2015   LDLCALC 117 10/07/2015   TRIG 79 10/07/2015   CHOLHDL 3.3 10/07/2015   7. Snores Situation Chance of Dozing or Sleeping  Sitting and reading 1 = slight chance of dozing or sleeping  Watching television 0 = would never doze or sleep  Sitting inactive in a  public place (theater or meeting) 0 = would never doze or sleep  Lying down in the afternoon when circumstances permit 3 = high chance of dozing or sleeping  Sitting as a passenger in a car for an hour 2 = moderate chance of dozing or sleeping  Sitting and talking to someone 0 = would never doze or sleep  Sitting quietly after lunch without alcohol 1 = slight chance of dozing or sleeping  In a car, while stopped for a few minutes in traffic 0 = would never doze or sleep  TOTAL 7   8. At risk for heart disease [name] is at a higher than average risk for cardiovascular disease due to obesity. Reviewed: no chest pain on exertion, no swelling of ankles.  9. Depression screening [name] was screened for depression as part of her new patient workup today.  Assessment/Plan:   1. Other fatigue [name] does not feel that her weight is causing her energy to be lower than it should be. Fatigue may be related to obesity, depression or many other causes. Labs will be ordered, and in the meanwhile, Xaniyah will focus on self care including making healthy food choices, increasing physical activity and focusing on stress reduction.  Orders - EKG 12-Lead  2. SOB (shortness of breath) on exertion Zykia does feel that her weight is causing her energy to be lower than it should be. Fatigue may be related to obesity, depression or many other causes. Labs will be ordered, and in the meanwhile, [name] will focus on self care including making healthy food choices, increasing physical activity and focusing on stress reduction.  3. Essential hypertension [name] is working on healthy weight loss and exercise to improve blood pressure control. We will watch for signs of hypotension as she continues her lifestyle modifications.  4. Prediabetes [name] will continue to work on weight loss, exercise, and decreasing simple carbohydrates to help decrease the risk of diabetes.   5. Vitamin D deficiency Will  request labs from Dr. Jacelyn Grip.  6. Other hyperlipidemia Cardiovascular risk and specific lipid/LDL goals reviewed.  We discussed several lifestyle modifications today and Monasia will continue to work on diet, exercise and weight loss efforts. Orders and follow up as documented in patient record.   Counseling Intensive lifestyle modifications are the first line treatment for this issue. . Dietary changes: Increase soluble fiber. Decrease simple carbohydrates. . Exercise changes: Moderate to vigorous-intensity aerobic activity 150 minutes per week if tolerated. . Lipid-lowering medications: see documented in medical record.  7. Snores Will continue to monitor. Intensive lifestyle modifications are the first line treatment for this issue. We discussed several lifestyle modifications today and she will continue to work on diet, exercise and weight loss efforts.   Counseling  Sleep apnea is a condition in which breathing pauses or becomes shallow during sleep. This happens over and over during the night. This disrupts your sleep and keeps your body from getting the rest that it needs, which can cause tiredness and lack of energy (fatigue) during the day.  Sleep apnea treatment: If you were given a device to open your airway while  you sleep, USE IT!  Sleep hygiene:   Limit or avoid alcohol, caffeinated beverages, and cigarettes, especially close to bedtime.   Do not eat a large meal or eat spicy foods right before bedtime. This can lead to digestive discomfort that can make it hard for you to sleep.  Keep a sleep diary to help you and your health care provider figure out what could be causing your insomnia.  . Make your bedroom a dark, comfortable place where it is easy to fall asleep. ? Put up shades or blackout curtains to block light from outside. ? Use a white noise machine to block noise. ? Keep the temperature cool. . Limit screen use before bedtime. This includes: ? Watching  TV. ? Using your smartphone, tablet, or computer. . Stick to a routine that includes going to bed and waking up at the same times every day and night. This can help you fall asleep faster. Consider making a quiet activity, such as reading, part of your nighttime routine. . Try to avoid taking naps during the day so that you sleep better at night. . Get out of bed if you are still awake after 15 minutes of trying to sleep. Keep the lights down, but try reading or doing a quiet activity. When you feel sleepy, go back to bed.  8. At risk for heart disease [name] was given approximately 15 minutes of coronary artery disease prevention counseling today. She is 49 y.o. female and has risk factors for heart disease including obesity. We discussed intensive lifestyle modifications today with an emphasis on specific weight loss instructions and strategies.   Repetitive spaced learning was employed today to elicit superior memory formation and behavioral change.  9. Depression screening Depression screen was negative today.  10. Class 2 severe obesity with serious comorbidity and body mass index (BMI) of 36.0 to 36.9 in adult, unspecified obesity type ([name]) [name] is currently in the action stage of change and her goal is to continue with weight loss efforts. I recommend Tosca begin the structured treatment plan as follows:  She has agreed to the Category 1 Plan.  Exercise goals: No exercise has been prescribed at this time.   Behavioral modification strategies: increasing lean protein intake, decreasing simple carbohydrates, increasing vegetables, increasing water intake and decreasing liquid calories.  She was informed of the importance of frequent follow-up visits to maximize her success with intensive lifestyle modifications for her multiple health conditions. She was informed we would discuss her lab results at her next visit unless there is a critical issue that needs to be addressed sooner.  [name] agreed to keep her next visit at the agreed upon time to discuss these results.  Objective:   Blood pressure 126/80, pulse 82, temperature 98.8 F (37.1 C), temperature source Oral, height 5\' 5"  (1.651 m), weight 216 lb (98 kg), last menstrual period 06/13/2011, SpO2 100 %. Body mass index is 35.94 kg/m.  EKG: Normal sinus rhythm, rate 85 bpm.  Indirect Calorimeter completed today shows a VO2 of 201 and a REE of 1400.  Her calculated basal metabolic rate is [id number] thus her basal metabolic rate is worse than expected.  General: Cooperative, alert, well developed, in no acute distress. HEENT: Conjunctivae and lids unremarkable. Cardiovascular: Regular rhythm.  Lungs: Normal work of breathing. Neurologic: No focal deficits.   Lab Results  Component Value Date   CREATININE 0.66 10/07/2015   BUN 13 10/07/2015   NA 137 10/07/2015   K 4.4 10/07/2015  CL 101 10/07/2015   CO2 26 10/07/2015   Lab Results  Component Value Date   ALT 19 10/07/2015   AST 20 10/07/2015   ALKPHOS 58 10/07/2015   BILITOT 0.3 10/07/2015   No results found for: HGBA1C No results found for: INSULIN Lab Results  Component Value Date   TSH 1.209 10/07/2015   Lab Results  Component Value Date   CHOL 190 10/07/2015   HDL 57 10/07/2015   LDLCALC 117 10/07/2015   TRIG 79 10/07/2015   CHOLHDL 3.3 10/07/2015   Lab Results  Component Value Date   WBC 6.0 10/07/2015   HGB 12.0 10/07/2015   HCT 35.8 (L) 10/07/2015   MCV 83.8 10/07/2015   PLT 369 10/07/2015   Attestation Statements:   This is the patient's first visit at [facility]. The patient's NEW PATIENT PACKET was reviewed at length. Included in the packet: current and past health history, medications, allergies, ROS, gynecologic history (women only), surgical history, family history, social history, weight history, weight loss surgery history (for those that have had weight loss surgery), nutritional evaluation, mood and food  questionnaire, PHQ9, Epworth questionnaire, sleep habits questionnaire, patient life and health improvement goals questionnaire. These will all be scanned into the patient's chart under media.   During the visit, I independently reviewed the patient's EKG, bioimpedance scale results, and indirect calorimeter results. I used this information to tailor a meal plan for the patient that will help her to lose weight and will improve her obesity-related conditions going forward. I performed a medically necessary appropriate examination and/or evaluation. I discussed the assessment and treatment plan with the patient. The patient was provided an opportunity to ask questions and all were answered. The patient agreed with the plan and demonstrated an understanding of the instructions. Labs were ordered at this visit and will be reviewed at the next visit unless more critical results need to be addressed immediately. Clinical information was updated and documented in the EMR.   I, Water quality scientist, CMA, am acting as Location manager for [employer], DO.  I have reviewed the above documentation for accuracy and completeness, and I agree with the above. Briscoe Deutscher, DO

## 2020-01-21 ENCOUNTER — Ambulatory Visit (INDEPENDENT_AMBULATORY_CARE_PROVIDER_SITE_OTHER): Payer: PRIVATE HEALTH INSURANCE | Admitting: Family Medicine

## 2020-01-21 ENCOUNTER — Encounter (INDEPENDENT_AMBULATORY_CARE_PROVIDER_SITE_OTHER): Payer: Self-pay | Admitting: Family Medicine

## 2020-01-21 ENCOUNTER — Other Ambulatory Visit: Payer: Self-pay

## 2020-01-21 VITALS — BP 129/76 | HR 76 | Temp 98.7°F | Ht 65.0 in | Wt 213.0 lb

## 2020-01-21 DIAGNOSIS — E559 Vitamin D deficiency, unspecified: Secondary | ICD-10-CM

## 2020-01-21 DIAGNOSIS — Z9189 Other specified personal risk factors, not elsewhere classified: Secondary | ICD-10-CM

## 2020-01-21 DIAGNOSIS — R7303 Prediabetes: Secondary | ICD-10-CM | POA: Diagnosis not present

## 2020-01-21 DIAGNOSIS — G4700 Insomnia, unspecified: Secondary | ICD-10-CM | POA: Diagnosis not present

## 2020-01-21 DIAGNOSIS — Z6835 Body mass index (BMI) 35.0-35.9, adult: Secondary | ICD-10-CM

## 2020-01-21 MED ORDER — METFORMIN HCL 500 MG PO TABS: 500.0000 mg | ORAL_TABLET | Freq: Two times a day (BID) | ORAL | 0 refills | Status: DC

## 2020-01-22 NOTE — Progress Notes (Signed)
Chief Complaint:   OBESITY [name] is here to discuss her progress with her obesity treatment plan along with follow-up of her obesity related diagnoses. [name] is on the Category 1 Plan and states she is following her eating plan approximately 90% of the time. [name] states she is exercising for 0 minutes 0 times per week.  Today's visit was #: 2 Starting weight: 216 lbs Starting date: 01/07/2020 Today's weight: 213 lbs Today's date: 01/21/2020 Total lbs lost to date: 3 lbs Total lbs lost since last in-office visit: 3 lbs  Interim History: [name] reports craving sweets at night.  She says she has followed the plan to a tee.  She has had 100 calorie popcorn or nuts for a snack.  Drinking 64 ounces of water a day.  She says she has had increased energy, but is still having difficulty sleeping.  Subjective:   1. Prediabetes [name] has a diagnosis of prediabetes based on her elevated HgA1c and was informed this puts her at greater risk of developing diabetes. She continues to work on diet and exercise to decrease her risk of diabetes. She denies nausea or hypoglycemia.  She has polyphagia at night.  2. Vitamin D deficiency She is currently taking no vitamin D supplement.  3. Insomnia [name] has difficulty sleeping.  Assessment/Plan:   1. Prediabetes [name] will continue to work on weight loss, exercise, and decreasing simple carbohydrates to help decrease the risk of diabetes.   Orders - metFORMIN (GLUCOPHAGE) 500 MG tablet; Take 1 tablet (500 mg total) by mouth 2 (two) times daily.  Dispense: 60 tablet; Refill: 0  2. Vitamin D deficiency Low Vitamin D level contributes to fatigue and are associated with obesity, breast, and colon cancer.   3. Insomnia Recommend melatonin 2 mg four hours prior to bed.  We also discussed several lifestyle modifications today and she will continue to work on diet, exercise and weight loss efforts.   Counseling  Limit or avoid alcohol,  caffeinated beverages, and cigarettes, especially close to bedtime.   Do not eat a large meal or eat spicy foods right before bedtime. This can lead to digestive discomfort that can make it hard for you to sleep.  Keep a sleep diary to help you and your health care provider figure out what could be causing your insomnia.  . Make your bedroom a dark, comfortable place where it is easy to fall asleep. ? Put up shades or blackout curtains to block light from outside. ? Use a white noise machine to block noise. ? Keep the temperature cool. . Limit screen use before bedtime. This includes: ? Watching TV. ? Using your smartphone, tablet, or computer. . Stick to a routine that includes going to bed and waking up at the same times every day and night. This can help you fall asleep faster. Consider making a quiet activity, such as reading, part of your nighttime routine. . Try to avoid taking naps during the day so that you sleep better at night. . Get out of bed if you are still awake after 15 minutes of trying to sleep. Keep the lights down, but try reading or doing a quiet activity. When you feel sleepy, go back to bed.  4. At risk for diabetes mellitus [name] was given approximately 15 minutes of diabetes education and counseling today. We discussed intensive lifestyle modifications today with an emphasis on weight loss as well as increasing exercise and decreasing simple carbohydrates in her diet. We also reviewed  medication options with an emphasis on risk versus benefit of those discussed.   Repetitive spaced learning was employed today to elicit superior memory formation and behavioral change.  5. Class 2 severe obesity with serious comorbidity and body mass index (BMI) of 35.0 to 35.9 in adult, unspecified obesity type ([place]) [name] is currently in the action stage of change. As such, her goal is to continue with weight loss efforts. She has agreed to the Category 1 Plan.   Exercise goals:  No exercise has been prescribed at this time.  Behavioral modification strategies: increasing lean protein intake and increasing water intake.  [name] has agreed to follow-up with our clinic in 2 weeks. She was informed of the importance of frequent follow-up visits to maximize her success with intensive lifestyle modifications for her multiple health conditions.   Objective:   Blood pressure 129/76, pulse 76, temperature 98.7 F (37.1 C), temperature source Oral, height 5\' 5"  (1.651 m), weight 213 lb (96.6 kg), last menstrual period 06/13/2011, SpO2 99 %. Body mass index is 35.45 kg/m.  General: Cooperative, alert, well developed, in no acute distress. HEENT: Conjunctivae and lids unremarkable. Cardiovascular: Regular rhythm.  Lungs: Normal work of breathing. Neurologic: No focal deficits.   Lab Results  Component Value Date   CREATININE 0.66 10/07/2015   BUN 13 10/07/2015   NA 137 10/07/2015   K 4.4 10/07/2015   CL 101 10/07/2015   CO2 26 10/07/2015   Lab Results  Component Value Date   ALT 19 10/07/2015   AST 20 10/07/2015   ALKPHOS 58 10/07/2015   BILITOT 0.3 10/07/2015   Lab Results  Component Value Date   TSH 1.209 10/07/2015   Lab Results  Component Value Date   CHOL 190 10/07/2015   HDL 57 10/07/2015   LDLCALC 117 10/07/2015   TRIG 79 10/07/2015   CHOLHDL 3.3 10/07/2015   Lab Results  Component Value Date   WBC 6.0 10/07/2015   HGB 12.0 10/07/2015   HCT 35.8 (L) 10/07/2015   MCV 83.8 10/07/2015   PLT 369 10/07/2015   Attestation Statements:   Reviewed by clinician on day of visit: allergies, medications, problem list, medical history, surgical history, family history, social history, and previous encounter notes.  I, Water quality scientist, CMA, am acting as Location manager for [employer], DO.  I have reviewed the above documentation for accuracy and completeness, and I agree with the above. Briscoe Deutscher, DO

## 2020-01-31 ENCOUNTER — Other Ambulatory Visit: Payer: Self-pay

## 2020-01-31 ENCOUNTER — Encounter (HOSPITAL_COMMUNITY): Payer: Self-pay

## 2020-01-31 ENCOUNTER — Ambulatory Visit (HOSPITAL_COMMUNITY)
Admission: EM | Admit: 2020-01-31 | Discharge: 2020-01-31 | Disposition: A | Discharge status: Home / Self Care | Payer: PRIVATE HEALTH INSURANCE | Attending: Family Medicine | Admitting: Family Medicine

## 2020-01-31 DIAGNOSIS — M5432 Sciatica, left side: Secondary | ICD-10-CM

## 2020-01-31 MED ORDER — METHYLPREDNISOLONE 4 MG PO TBPK: ORAL_TABLET | ORAL | 0 refills | Status: DC

## 2020-01-31 MED ORDER — TRAMADOL-ACETAMINOPHEN 37.5-325 MG PO TABS: 2.0000 | ORAL_TABLET | Freq: Four times a day (QID) | ORAL | 0 refills | Status: DC | PRN

## 2020-01-31 NOTE — ED Triage Notes (Signed)
Pt pulled her hamstring during excerise on 01/26/2020, she saw her doctor & he confirmed it was a pulled hamstring on 01/28/2020. Pt is here stating it is still painful, states her doctor did not refer her to Sports Medicine/Ortho for follow up.

## 2020-01-31 NOTE — Discharge Instructions (Signed)
Take the Medrol Dosepak as prescribed Take all of day 1 today Activity as tolerated.  Avoid actions that increase your pain Take tramadol as needed for pain in addition to the Medrol Stop Aleve while on Medrol Call the sports medicine clinic today to be seen next week

## 2020-01-31 NOTE — ED Provider Notes (Signed)
[place]    CSN: [id number] Arrival date & time: 01/31/20  1001      History   Chief Complaint Chief Complaint  Patient presents with  . Leg Injury    left    HPI [name] is a 49 y.o. female.   HPI  Patient started an exercise program in order to lose weight.  She did some jogging, burning forward and backwards on a track.  She felt well at the time.  Woke up the next morning with severe pain going from her left buttock down to the left knee.  She has some pain below the knee.  She was seen by her primary care doctor.  Diagnosed with a hamstring strain.  She was told to take over-the-counter medicines for pain.  Use ice or heat.  Return as needed She is here because she is continuing to have pain.  Pain goes from her left buttock down to the left calf.  Worse with activity.  It is keeping her awake at night.  No numbness or weakness.  No bowel or bladder complaint. Patient states she does have a history of an old lumbar disc injury with sciatica.  She does remember which side.  This was many years ago.  It has not recurred  Past Medical History:  Diagnosis Date  . Allergy   . Anemia   . Anemia   . Back pain   . DUB (dysfunctional uterine bleeding)   . Fibroids   . High cholesterol   . Hypertension   . No pertinent past medical history   . Prediabetes   . Smoking history 07/12/11   no hx of  smoking  . Vertigo   . Vitamin D deficiency     Patient Active Problem List   Diagnosis Date Noted  . Anemia 03/26/2013  . Smoking history 07/12/2011  . 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 ABDOMINAL HYSTERECTOMY    . CESAREAN SECTION  2006, 2008   x 2  . MYOMECTOMY  2003  . TUBAL LIGATION      OB History    Gravida  2   Para      Term      Preterm      AB      Living        SAB      TAB      Ectopic      Multiple      Live Births               Home  Medications    Prior to Admission medications   Medication Sig Start Date End Date Taking? Authorizing Provider  amLODipine (NORVASC) 5 MG tablet Take 1 tablet (5 mg total) by mouth daily. 10/08/15   Ivar Drape D, PA  metFORMIN (GLUCOPHAGE) 500 MG tablet Take 1 tablet (500 mg total) by mouth 2 (two) times daily. 01/21/20   Briscoe Deutscher, DO  methylPREDNISolone (MEDROL DOSEPAK) 4 MG TBPK tablet tad 01/31/20   Raylene Everts, MD  traMADol-acetaminophen (ULTRACET) 37.5-325 MG tablet Take 2 tablets by mouth every 6 (six) hours as needed. 01/31/20   Raylene Everts, MD    Family History Family History  Problem Relation Age of Onset  . Heart disease Mother        angina  . Hypertension Mother   . Hyperlipidemia Mother   . Cancer Father  prostate  . Diabetes Father   . Alcoholism Father   . Heart disease Maternal Grandmother   . Diabetes Maternal Grandfather   . Diabetes Paternal Grandfather     Social History Social History   Tobacco Use  . Smoking status: Former Research scientist (life sciences)  . Smokeless tobacco: Never Used  Substance Use Topics  . Alcohol use: Yes    Alcohol/week: 0.0 standard drinks    Comment: occas  . Drug use: No     Allergies   Patient has no known allergies.   Review of Systems Review of Systems  Musculoskeletal: Positive for back pain and gait problem.     Physical Exam Triage Vital Signs ED Triage Vitals  Enc Vitals Group     BP 01/31/20 1034 119/88     Pulse Rate 01/31/20 1034 92     Resp 01/31/20 1034 19     Temp 01/31/20 1034 98.2 F (36.8 C)     Temp Source 01/31/20 1034 Oral     SpO2 01/31/20 1034 100 %     Weight 01/31/20 1032 213 lb (96.6 kg)     Height --      Head Circumference --      Peak Flow --      Pain Score 01/31/20 1031 7     Pain Loc --      Pain Edu? --      Excl. in [place]? --    No data found.  Updated Vital Signs BP 119/88 (BP Location: Right Arm)   Pulse 92   Temp 98.2 F (36.8 C) (Oral)   Resp 19   Wt  96.6 kg   LMP 06/13/2011   SpO2 100%   BMI 35.45 kg/m      Physical Exam Constitutional:      General: She is not in acute distress.    Appearance: She is well-developed.     Comments: Overweight.  Appears uncomfortable.  HENT:     Head: Normocephalic and atraumatic.     Mouth/Throat:     Comments: Mask is in place Eyes:     Conjunctiva/sclera: Conjunctivae normal.     Pupils: Pupils are equal, round, and reactive to light.  Cardiovascular:     Rate and Rhythm: Normal rate.  Pulmonary:     Effort: Pulmonary effort is normal. No respiratory distress.  Abdominal:     General: There is no distension.     Palpations: Abdomen is soft.  Musculoskeletal:        General: Normal range of motion.     Cervical back: Normal range of motion.     Comments: No tenderness of the lumbar spine.  No tenderness of ischial tuberosity or hamstring insertion.  There is no bruising on the hamstring.  With patient seated she has increased pain with straight leg raise.  No swelling of the leg.  Reflexes are intact.  Sensory exam is intact  Skin:    General: Skin is warm and dry.  Neurological:     Mental Status: She is alert.     Sensory: No sensory deficit.     Motor: No weakness.     Gait: Gait abnormal.     Deep Tendon Reflexes: Reflexes normal.  Psychiatric:        Mood and Affect: Mood normal.        Behavior: Behavior normal.      UC Treatments / Results  Labs (all labs ordered are listed, but only abnormal results are displayed) Labs  Reviewed - No data to display  EKG   Radiology No results found.  Procedures Procedures (including critical care time)  Medications Ordered in UC Medications - No data to display  Initial Impression / Assessment and Plan / UC Course  I have reviewed the triage vital signs and the nursing notes.  Pertinent labs & imaging results that were available during my care of the patient were reviewed by me and considered in my medical decision making  (see chart for details).     Patient may have a hamstring injury.  Difficult to tell if this might be sciatica.  On my exam looks more like sciatica.  I am to give her steroids pain management.  Referral to sports medicine.  I think she would benefit from physical therapy.  X-rays are not indicated at this juncture Final Clinical Impressions(s) / UC Diagnoses   Final diagnoses:  Sciatica of left side     Discharge Instructions     Take the Medrol Dosepak as prescribed Take all of day 1 today Activity as tolerated.  Avoid actions that increase your pain Take tramadol as needed for pain in addition to the Medrol Stop Aleve while on Medrol Call the sports medicine clinic today to be seen next week   ED Prescriptions    Medication Sig Dispense Auth. Provider   methylPREDNISolone (MEDROL DOSEPAK) 4 MG TBPK tablet tad 21 tablet Raylene Everts, MD   traMADol-acetaminophen (ULTRACET) 37.5-325 MG tablet Take 2 tablets by mouth every 6 (six) hours as needed. 30 tablet Raylene Everts, MD     I have reviewed the PDMP during this encounter.   Raylene Everts, MD 01/31/20 [phone]

## 2020-02-10 ENCOUNTER — Ambulatory Visit: Payer: PRIVATE HEALTH INSURANCE | Admitting: Family Medicine

## 2020-02-17 ENCOUNTER — Encounter (INDEPENDENT_AMBULATORY_CARE_PROVIDER_SITE_OTHER): Payer: Self-pay | Admitting: Family Medicine

## 2020-02-17 ENCOUNTER — Other Ambulatory Visit: Payer: Self-pay

## 2020-02-17 ENCOUNTER — Ambulatory Visit (INDEPENDENT_AMBULATORY_CARE_PROVIDER_SITE_OTHER): Payer: PRIVATE HEALTH INSURANCE | Admitting: Family Medicine

## 2020-02-17 VITALS — BP 127/83 | HR 88 | Temp 99.3°F | Ht 65.0 in | Wt 209.0 lb

## 2020-02-17 DIAGNOSIS — R7303 Prediabetes: Secondary | ICD-10-CM | POA: Diagnosis not present

## 2020-02-17 DIAGNOSIS — Z9189 Other specified personal risk factors, not elsewhere classified: Secondary | ICD-10-CM

## 2020-02-17 DIAGNOSIS — E559 Vitamin D deficiency, unspecified: Secondary | ICD-10-CM | POA: Diagnosis not present

## 2020-02-17 DIAGNOSIS — E669 Obesity, unspecified: Secondary | ICD-10-CM

## 2020-02-17 DIAGNOSIS — I10 Essential (primary) hypertension: Secondary | ICD-10-CM

## 2020-02-17 DIAGNOSIS — Z6834 Body mass index (BMI) 34.0-34.9, adult: Secondary | ICD-10-CM

## 2020-02-17 NOTE — Progress Notes (Signed)
Chief Complaint:   OBESITY [name] is here to discuss her progress with her obesity treatment plan along with follow-up of her obesity related diagnoses. [name] is on the Category 1 Plan and states she is following her eating plan approximately 75% of the time. [name] states she is exercising for 0 minutes 0 times per week.  Today's visit was #: 3 Starting weight: 216 lbs Starting date: 01/07/2020 Today's weight: 209 lbs Today's date: 02/17/2020 Total lbs lost to date: 7 lbs Total lbs lost since last in-office visit:4 lbs  Interim History: [name] turned 49 on 02/10/2020 and celebrated with a "birthday week.".  She had an Urgent Care visit on 01/31/2020 for left-sided sciatica, brought on by walking backwards on the track. She and her husband continue to adhere to the diet fairly well. She would like to incorporate corn.  Subjective:   1. Vitamin D deficiency She is currently taking no vitamin D supplement. She denies nausea, vomiting or muscle weakness.  2. Prediabetes [name] has a diagnosis of prediabetes based on her elevated HgA1c and was informed this puts her at greater risk of developing diabetes. She continues to work on diet and exercise to decrease her risk of diabetes. She denies nausea or hypoglycemia.  She is taking metformin 500 mg twice daily.  3. Essential hypertension Review: taking medications as instructed, no medication side effects noted, no chest pain on exertion, no dyspnea on exertion, no swelling of ankles.  She is taking amlodipine for blood pressure.  At goal today at 127/83.  BP Readings from Last 3 Encounters:  02/17/20 127/83  01/31/20 119/88  01/21/20 129/76   Assessment/Plan:   1. Vitamin D deficiency Low Vitamin D level contributes to fatigue and are associated with obesity, breast, and colon cancer.  2. Prediabetes [name] will continue to work on weight loss, exercise, and decreasing simple carbohydrates to help decrease the risk of  diabetes.   3. Essential hypertension [name] is working on healthy weight loss and exercise to improve blood pressure control. We will watch for signs of hypotension as she continues her lifestyle modifications.  4. At risk for diabetes mellitus [name] was given approximately 15 minutes of diabetes education and counseling today. We discussed intensive lifestyle modifications today with an emphasis on weight loss as well as increasing exercise and decreasing simple carbohydrates in her diet. We also reviewed medication options with an emphasis on risk versus benefit of those discussed.   Repetitive spaced learning was employed today to elicit superior memory formation and behavioral change.  5. Class 1 obesity with serious comorbidity and body mass index (BMI) of 34.0 to 34.9 in adult, unspecified obesity type [place] is currently in the action stage of change. As such, her goal is to continue with weight loss efforts. She has agreed to the Category 1 Plan.   Exercise goals: For substantial health benefits, adults should do at least 150 minutes (2 hours and 30 minutes) a week of moderate-intensity, or 75 minutes (1 hour and 15 minutes) a week of vigorous-intensity aerobic physical activity, or an equivalent combination of moderate- and vigorous-intensity aerobic activity. Aerobic activity should be performed in episodes of at least 10 minutes, and preferably, it should be spread throughout the week.  Behavioral modification strategies: increasing lean protein intake and increasing water intake.  [name] has agreed to follow-up with our clinic in 2 weeks. She was informed of the importance of frequent follow-up visits to maximize her success with intensive lifestyle modifications for her  multiple health conditions.   Objective:   Blood pressure 127/83, pulse 88, temperature 99.3 F (37.4 C), temperature source Oral, height 5\' 5"  (1.651 m), weight 209 lb (94.8 kg), last menstrual period  06/13/2011, SpO2 100 %. Body mass index is 34.78 kg/m.  General: Cooperative, alert, well developed, in no acute distress. HEENT: Conjunctivae and lids unremarkable. Cardiovascular: Regular rhythm.  Lungs: Normal work of breathing. Neurologic: No focal deficits.   Lab Results  Component Value Date   CREATININE 0.66 10/07/2015   BUN 13 10/07/2015   NA 137 10/07/2015   K 4.4 10/07/2015   CL 101 10/07/2015   CO2 26 10/07/2015   Lab Results  Component Value Date   ALT 19 10/07/2015   AST 20 10/07/2015   ALKPHOS 58 10/07/2015   BILITOT 0.3 10/07/2015   Lab Results  Component Value Date   TSH 1.209 10/07/2015   Lab Results  Component Value Date   CHOL 190 10/07/2015   HDL 57 10/07/2015   LDLCALC 117 10/07/2015   TRIG 79 10/07/2015   CHOLHDL 3.3 10/07/2015   Lab Results  Component Value Date   WBC 6.0 10/07/2015   HGB 12.0 10/07/2015   HCT 35.8 (L) 10/07/2015   MCV 83.8 10/07/2015   PLT 369 10/07/2015   Attestation Statements:   Reviewed by clinician on day of visit: allergies, medications, problem list, medical history, surgical history, family history, social history, and previous encounter notes.  I, Water quality scientist, CMA, am acting as Location manager for [employer], DO.  I have reviewed the above documentation for accuracy and completeness, and I agree with the above. Briscoe Deutscher, DO

## 2020-03-09 ENCOUNTER — Other Ambulatory Visit: Payer: Self-pay

## 2020-03-09 ENCOUNTER — Encounter (INDEPENDENT_AMBULATORY_CARE_PROVIDER_SITE_OTHER): Payer: Self-pay | Admitting: Family Medicine

## 2020-03-09 ENCOUNTER — Ambulatory Visit (INDEPENDENT_AMBULATORY_CARE_PROVIDER_SITE_OTHER): Payer: PRIVATE HEALTH INSURANCE | Admitting: Family Medicine

## 2020-03-09 VITALS — BP 131/82 | HR 81 | Temp 98.6°F | Ht 65.0 in | Wt 210.0 lb

## 2020-03-09 DIAGNOSIS — Z9189 Other specified personal risk factors, not elsewhere classified: Secondary | ICD-10-CM

## 2020-03-09 DIAGNOSIS — I10 Essential (primary) hypertension: Secondary | ICD-10-CM | POA: Diagnosis not present

## 2020-03-09 DIAGNOSIS — R7303 Prediabetes: Secondary | ICD-10-CM | POA: Diagnosis not present

## 2020-03-09 DIAGNOSIS — Z6834 Body mass index (BMI) 34.0-34.9, adult: Secondary | ICD-10-CM

## 2020-03-09 DIAGNOSIS — E559 Vitamin D deficiency, unspecified: Secondary | ICD-10-CM | POA: Diagnosis not present

## 2020-03-09 DIAGNOSIS — E669 Obesity, unspecified: Secondary | ICD-10-CM

## 2020-03-09 NOTE — Progress Notes (Signed)
Chief Complaint:   OBESITY [name] is here to discuss her progress with her obesity treatment plan along with follow-up of her obesity related diagnoses. [name] is on the Category 1 Plan and states she is following her eating plan approximately 90% of the time. [name] states she is exercising for 0 minutes 0 times per week.  Today's visit was #: 4 Starting weight: 216 lbs Starting date: 01/07/2020 Today's weight: 210 lbs Today's date: 03/09/2020 Total lbs lost to date: 6 lbs Total lbs lost since last in-office visit: 0  Interim History: [name] has had decreased food intake due to abdominal discomfort, bloating, and diarrhea since taking metformin consistently.  Subjective:   1. Prediabetes [name] has a diagnosis of prediabetes based on her elevated HgA1c and was informed this puts her at greater risk of developing diabetes. She continues to work on diet and exercise to decrease her risk of diabetes. She denies nausea or hypoglycemia.  [name] is taking metformin 500 mg twice daily.  2. Vitamin D deficiency She is currently taking prescription vitamin D 50,000 IU each week. She denies nausea, vomiting or muscle weakness.  3. Essential hypertension Review: taking medications as instructed, no medication side effects noted, no chest pain on exertion, no dyspnea on exertion, no swelling of ankles.  She takes Norvasc for blood pressure control.  BP Readings from Last 3 Encounters:  03/09/20 131/82  02/17/20 127/83  01/31/20 119/88   4. At risk for heart disease [name] is at a higher than average risk for cardiovascular disease due to obesity.   Assessment/Plan:   1. Prediabetes [name] will continue to work on weight loss, exercise, and decreasing simple carbohydrates to help decrease the risk of diabetes.  She will stop metformin due to GI side effects.  She will increase her exercise and water intake.  2. Vitamin D deficiency Low Vitamin D level contributes to fatigue  and are associated with obesity, breast, and colon cancer. She agrees to continue to take prescription Vitamin D @50 ,000 IU every week and will follow-up for routine testing of Vitamin D, at least 2-3 times per year to avoid over-replacement.  3. Essential hypertension [name] is working on healthy weight loss and exercise to improve blood pressure control. We will watch for signs of hypotension as she continues her lifestyle modifications.  4. At risk for heart disease [name] was given approximately 15 minutes of coronary artery disease prevention counseling today. She is 49 y.o. female and has risk factors for heart disease including obesity. We discussed intensive lifestyle modifications today with an emphasis on specific weight loss instructions and strategies.   Repetitive spaced learning was employed today to elicit superior memory formation and behavioral change.  5. Class 1 obesity with serious comorbidity and body mass index (BMI) of 34.0 to 34.9 in adult, unspecified obesity type [name] is currently in the action stage of change. As such, her goal is to continue with weight loss efforts. She has agreed to the Category 1 Plan.   Exercise goals: For substantial health benefits, adults should do at least 150 minutes (2 hours and 30 minutes) a week of moderate-intensity, or 75 minutes (1 hour and 15 minutes) a week of vigorous-intensity aerobic physical activity, or an equivalent combination of moderate- and vigorous-intensity aerobic activity. Aerobic activity should be performed in episodes of at least 10 minutes, and preferably, it should be spread throughout the week.  Behavioral modification strategies: increasing lean protein intake and increasing water intake.  [name] has agreed  to follow-up with our clinic in 2 weeks. She was informed of the importance of frequent follow-up visits to maximize her success with intensive lifestyle modifications for her multiple health conditions.    Objective:   Blood pressure 131/82, pulse 81, temperature 98.6 F (37 C), temperature source Oral, height 5\' 5"  (1.651 m), weight 210 lb (95.3 kg), last menstrual period 06/13/2011, SpO2 100 %. Body mass index is 34.95 kg/m.  General: Cooperative, alert, well developed, in no acute distress. HEENT: Conjunctivae and lids unremarkable. Cardiovascular: Regular rhythm.  Lungs: Normal work of breathing. Neurologic: No focal deficits.   Lab Results  Component Value Date   CREATININE 0.66 10/07/2015   BUN 13 10/07/2015   NA 137 10/07/2015   K 4.4 10/07/2015   CL 101 10/07/2015   CO2 26 10/07/2015   Lab Results  Component Value Date   ALT 19 10/07/2015   AST 20 10/07/2015   ALKPHOS 58 10/07/2015   BILITOT 0.3 10/07/2015   Lab Results  Component Value Date   TSH 1.209 10/07/2015   Lab Results  Component Value Date   CHOL 190 10/07/2015   HDL 57 10/07/2015   LDLCALC 117 10/07/2015   TRIG 79 10/07/2015   CHOLHDL 3.3 10/07/2015   Lab Results  Component Value Date   WBC 6.0 10/07/2015   HGB 12.0 10/07/2015   HCT 35.8 (L) 10/07/2015   MCV 83.8 10/07/2015   PLT 369 10/07/2015   Attestation Statements:   Reviewed by clinician on day of visit: allergies, medications, problem list, medical history, surgical history, family history, social history, and previous encounter notes.  I, Water quality scientist, CMA, am acting as Location manager for [employer], DO.  I have reviewed the above documentation for accuracy and completeness, and I agree with the above. Briscoe Deutscher, DO

## 2020-04-01 ENCOUNTER — Ambulatory Visit (INDEPENDENT_AMBULATORY_CARE_PROVIDER_SITE_OTHER): Payer: PRIVATE HEALTH INSURANCE | Admitting: Family Medicine

## 2020-04-08 ENCOUNTER — Ambulatory Visit (INDEPENDENT_AMBULATORY_CARE_PROVIDER_SITE_OTHER): Payer: PRIVATE HEALTH INSURANCE | Admitting: Family Medicine

## 2020-04-08 ENCOUNTER — Encounter (INDEPENDENT_AMBULATORY_CARE_PROVIDER_SITE_OTHER): Payer: Self-pay | Admitting: Family Medicine

## 2020-04-08 ENCOUNTER — Other Ambulatory Visit: Payer: Self-pay

## 2020-04-08 VITALS — BP 112/76 | HR 80 | Temp 98.6°F | Ht 65.0 in | Wt 208.0 lb

## 2020-04-08 DIAGNOSIS — E669 Obesity, unspecified: Secondary | ICD-10-CM

## 2020-04-08 DIAGNOSIS — Z6834 Body mass index (BMI) 34.0-34.9, adult: Secondary | ICD-10-CM

## 2020-04-08 DIAGNOSIS — R7303 Prediabetes: Secondary | ICD-10-CM | POA: Diagnosis not present

## 2020-04-08 DIAGNOSIS — E559 Vitamin D deficiency, unspecified: Secondary | ICD-10-CM

## 2020-04-08 MED ORDER — VITAMIN D (ERGOCALCIFEROL) 1.25 MG (50000 UNIT) PO CAPS: 50000.0000 [IU] | ORAL_CAPSULE | ORAL | 0 refills | Status: DC

## 2020-04-08 NOTE — Progress Notes (Signed)
Chief Complaint:   OBESITY [name] is here to discuss her progress with her obesity treatment plan along with follow-up of her obesity related diagnoses. [name] is on the Category 1 Plan and states she is following her eating plan approximately 70% of the time. [name] states she is exercising for 0 minutes 0 times per week.  Today's visit was #: 5 Starting weight: 216 lbs Starting date: 01/07/2020 Today's weight: 208 lbs Today's date: 04/09/2020 Total lbs lost to date: 8 lbs Total lbs lost since last in-office visit: 2 lbs  Interim History: [name] has been traveling a lot for her son's lacrosse over the last several weeks.  She says she is trying to eat protein on the road.  She has been walking more as well recently.  When she eats the food on the plan, her hunger and cravings are well-controlled.  Subjective:   1. Prediabetes [name] has a diagnosis of prediabetes based on her elevated HgA1c and was informed this puts her at greater risk of developing diabetes. She continues to work on diet and exercise to decrease her risk of diabetes. She denies nausea or hypoglycemia.  [name] goes to see her PCP each July.  Last A1c is not in her chart and she was last seen by her PCP about 1 year ago.  Denies symptoms or concerns.  Dr. Juleen China placed her on metformin in the past, but at last office visit, took her off.  2. Vitamin D deficiency She is currently taking prescription vitamin D 50,000 IU each week. She denies nausea, vomiting or muscle weakness.  Tyrese says she tolerates the supplement well with no side effects or problems.  Assessment/Plan:   1. Prediabetes [name] will continue to work on weight loss, exercise, and decreasing simple carbohydrates to help decrease the risk of diabetes.  [name] needs labs and we will plan to get these at next office visit.  I told her to come fasting.  Prudent nutritional plan and weight loss.  2. Vitamin D deficiency Low Vitamin D level  contributes to fatigue and are associated with obesity, breast, and colon cancer. She agrees to continue to take prescription Vitamin D @50 ,000 IU every week and will follow-up for routine testing of Vitamin D, at least 2-3 times per year to avoid over-replacement.  Will need labs at next office visit. - Vitamin D, Ergocalciferol, (DRISDOL) 1.25 MG (50000 UNIT) CAPS capsule; Take 1 capsule (50,000 Units total) by mouth every 7 (seven) days.  Dispense: 5 capsule; Refill: 0  3. Class 1 obesity with serious comorbidity and body mass index (BMI) of 34.0 to 34.9 in adult, unspecified obesity type [name] is currently in the action stage of change. As such, her goal is to continue with weight loss efforts. She has agreed to the Category 1 Plan.   Exercise goals: As is.  Behavioral modification strategies: increasing lean protein intake and travel eating strategies.  Come fasting to next office visit as she needs fasting blood work (A1c, etc.).  [name] has agreed to follow-up with our clinic in 2-3 weeks, fasting. She was informed of the importance of frequent follow-up visits to maximize her success with intensive lifestyle modifications for her multiple health conditions.   Objective:   Blood pressure 112/76, pulse 80, temperature 98.6 F (37 C), temperature source Oral, height 5\' 5"  (1.651 m), weight 208 lb (94.3 kg), last menstrual period 06/13/2011, SpO2 100 %. Body mass index is 34.61 kg/m.  General: Cooperative, alert, well developed, in no  acute distress. HEENT: Conjunctivae and lids unremarkable. Cardiovascular: Regular rhythm.  Lungs: Normal work of breathing. Neurologic: No focal deficits.   Lab Results  Component Value Date   CREATININE 0.66 10/07/2015   BUN 13 10/07/2015   NA 137 10/07/2015   K 4.4 10/07/2015   CL 101 10/07/2015   CO2 26 10/07/2015   Lab Results  Component Value Date   ALT 19 10/07/2015   AST 20 10/07/2015   ALKPHOS 58 10/07/2015   BILITOT 0.3  10/07/2015   Lab Results  Component Value Date   TSH 1.209 10/07/2015   Lab Results  Component Value Date   CHOL 190 10/07/2015   HDL 57 10/07/2015   LDLCALC 117 10/07/2015   TRIG 79 10/07/2015   CHOLHDL 3.3 10/07/2015   Lab Results  Component Value Date   WBC 6.0 10/07/2015   HGB 12.0 10/07/2015   HCT 35.8 (L) 10/07/2015   MCV 83.8 10/07/2015   PLT 369 10/07/2015   Attestation Statements:   Reviewed by clinician on day of visit: allergies, medications, problem list, medical history, surgical history, family history, social history, and previous encounter notes.  Time spent on visit including pre-visit chart review and post-visit care and charting was 27 minutes.   I, Water quality scientist, CMA, am acting as Location manager for [employer], DO.  I have reviewed the above documentation for accuracy and completeness, and I agree with the above. Mellody Dance, DO

## 2020-04-29 ENCOUNTER — Ambulatory Visit (INDEPENDENT_AMBULATORY_CARE_PROVIDER_SITE_OTHER): Payer: PRIVATE HEALTH INSURANCE | Admitting: Family Medicine

## 2020-04-29 ENCOUNTER — Other Ambulatory Visit: Payer: Self-pay

## 2020-04-29 ENCOUNTER — Encounter (INDEPENDENT_AMBULATORY_CARE_PROVIDER_SITE_OTHER): Payer: Self-pay | Admitting: Family Medicine

## 2020-04-29 VITALS — BP 140/88 | HR 77 | Temp 98.0°F | Ht 65.0 in | Wt 211.0 lb

## 2020-04-29 DIAGNOSIS — E559 Vitamin D deficiency, unspecified: Secondary | ICD-10-CM

## 2020-04-29 DIAGNOSIS — Z6835 Body mass index (BMI) 35.0-35.9, adult: Secondary | ICD-10-CM

## 2020-04-29 DIAGNOSIS — R7303 Prediabetes: Secondary | ICD-10-CM | POA: Diagnosis not present

## 2020-04-29 DIAGNOSIS — F3289 Other specified depressive episodes: Secondary | ICD-10-CM

## 2020-04-29 DIAGNOSIS — I10 Essential (primary) hypertension: Secondary | ICD-10-CM

## 2020-04-29 DIAGNOSIS — R5383 Other fatigue: Secondary | ICD-10-CM | POA: Diagnosis not present

## 2020-04-29 NOTE — Progress Notes (Signed)
Chief Complaint:   OBESITY Pernella is here to discuss her progress with her obesity treatment plan along with follow-up of her obesity related diagnoses. Betha is on the Category 1 Plan and states she is following her eating plan approximately 20% of the time. Rigby states she is exercising for 0 minutes 0 times per week.  Today's visit was #: 6 Starting weight: 216 lbs Starting date: 01/07/2020 Today's weight: 211 lbs Today's date: 04/29/2020 Total lbs lost to date: 5 lbs Total lbs lost since last in-office visit: 0  Interim History: Lundyn says she is drinking half her body weight in pounds in ounces of wter per day.  Overall, she is taking on stressors of her husband, Pieter Partridge, who also comes here and increased stress at work, and most of the time feels exhausted.  She takes very little time for herself.    Subjective:   1. Essential hypertension Review: taking medications as instructed, no medication side effects noted, no chest pain on exertion, no dyspnea on exertion, no swelling of ankles.  Blood pressure was 112/76 at last office visit.  Little high today, but upon recheck today, it was at goal.  No symptoms at all.  BP Readings from Last 3 Encounters:  04/29/20 140/88  04/08/20 112/76  03/09/20 131/82   2. Vitamin D deficiency She is currently taking OTC vitamin D 1000 IU each day.  This was changed from 5000 IU daily in 12/2019.  She denies nausea, vomiting or muscle weakness.    3. Prediabetes Rinnah has a diagnosis of prediabetes based on her elevated HgA1c and was informed this puts her at greater risk of developing diabetes. She continues to work on diet and exercise to decrease her risk of diabetes. She denies nausea or hypoglycemia.  She says that metformin made her have GI upset with nausea and diarrhea.  She does not have anything now.  4. Other fatigue Delynn endorses feeling fatigued and exhausted the majority of the time.  5. Other depression with  emotional eating Elvis is struggling with emotional eating and using food for comfort to the extent that it is negatively impacting her health. She has been working on behavior modification techniques to help reduce her emotional eating and has been unsuccessful. She shows no sign of suicidal or homicidal ideations.  Positive for poor self-care and a lot of self-induced stress.  She is a perfectionist with everything she does.  She wants to do better going forward for herself.  Assessment/Plan:   1. Essential hypertension Deanne is working on healthy weight loss and exercise to improve blood pressure control. We will watch for signs of hypotension as she continues her lifestyle modifications.  Take medications and get a home blood pressure monitor.  Check blood pressure every 2-3 days.  Continue prudent nutritional plan, weight loss. - Comprehensive metabolic panel - CBC with Differential/Platelet - Lipid panel - Vitamin B12 - Folate - T3, free - T4, free - TSH  2. Vitamin D deficiency Check labs.  Continue prudent nutritional plan, weight loss. - VITAMIN D 25 Hydroxy (Vit-D Deficiency, Fractures)  3. Prediabetes Kaylanie will continue to work on weight loss, exercise, and decreasing simple carbohydrates to help decrease the risk of diabetes.  Check labs.  Continue prudent nutritional plan, weight loss. - Hemoglobin A1c - Insulin, random  4. Other fatigue Will check labs today.  5. Other depression with emotional eating Patient was referred to Dr. Mallie Mussel, our Bariatric Psychologist, for evaluation due to her  elevated PHQ-9 score and significant struggles with emotional eating.  Check labs.  Continue with prudent nutritional plan, weight loss, journal, and meditate.  6. Class 2 severe obesity with serious comorbidity and body mass index (BMI) of 35.0 to 35.9 in adult, unspecified obesity type ([place]) Jackqueline is currently in the action stage of change. As such, her goal is to continue  with weight loss efforts. She has agreed to the Category 1 Plan.   Exercise goals: As is.  Behavioral modification strategies: planning for success.  Calm app for 10-15 minutes twice daily.    Journal 5 minutes daily on how to perform better self-care.  Extensive discussion held with her today regarding her perfectionist personality and lack of self-care.  Lailany has agreed to follow-up with our clinic in 2-3 weeks. She was informed of the importance of frequent follow-up visits to maximize her success with intensive lifestyle modifications for her multiple health conditions.   Messiah was informed we would discuss her lab results at her next visit unless there is a critical issue that needs to be addressed sooner. Cohen agreed to keep her next visit at the agreed upon time to discuss these results.  Objective:   Blood pressure 140/88, pulse 77, temperature 98 F (36.7 C), temperature source Oral, height 5\' 5"  (1.651 m), weight 211 lb (95.7 kg), last menstrual period 06/13/2011, SpO2 100 %. Body mass index is 35.11 kg/m.  General: Cooperative, alert, well developed, in no acute distress. HEENT: Conjunctivae and lids unremarkable. Cardiovascular: Regular rhythm.  Lungs: Normal work of breathing. Neurologic: No focal deficits.   Lab Results  Component Value Date   CREATININE 0.66 10/07/2015   BUN 13 10/07/2015   NA 137 10/07/2015   K 4.4 10/07/2015   CL 101 10/07/2015   CO2 26 10/07/2015   Lab Results  Component Value Date   ALT 19 10/07/2015   AST 20 10/07/2015   ALKPHOS 58 10/07/2015   BILITOT 0.3 10/07/2015   Lab Results  Component Value Date   TSH 1.209 10/07/2015   Lab Results  Component Value Date   CHOL 190 10/07/2015   HDL 57 10/07/2015   LDLCALC 117 10/07/2015   TRIG 79 10/07/2015   CHOLHDL 3.3 10/07/2015   Lab Results  Component Value Date   WBC 6.0 10/07/2015   HGB 12.0 10/07/2015   HCT 35.8 (L) 10/07/2015   MCV 83.8 10/07/2015   PLT 369  10/07/2015   Attestation Statements:   Reviewed by clinician on day of visit: allergies, medications, problem list, medical history, surgical history, family history, social history, and previous encounter notes.  Time spent on visit including pre-visit chart review and post-visit care and charting was 50 minutes.   I, Water quality scientist, CMA, am acting as Location manager for [employer], DO.  I have reviewed the above documentation for accuracy and completeness, and I agree with the above. Mellody Dance, DO

## 2020-04-30 LAB — LIPID PANEL
Chol/HDL Ratio: 3.7 ratio (ref 0.0–4.4)
Cholesterol, Total: 218 mg/dL — ABNORMAL HIGH (ref 100–199)
HDL: 59 mg/dL (ref >39)
LDL Chol Calc (NIH): 148 mg/dL — ABNORMAL HIGH (ref 0–99)
Triglycerides: 63 mg/dL (ref 0–149)
VLDL Cholesterol Cal: 11 mg/dL (ref 5–40)

## 2020-04-30 LAB — CBC WITH DIFFERENTIAL/PLATELET
Basophils Absolute: 0 10*3/uL (ref 0.0–0.2)
Basos: 1 %
EOS (ABSOLUTE): 0.2 10*3/uL (ref 0.0–0.4)
Eos: 3 %
Hematocrit: 37.9 % (ref 34.0–46.6)
Hemoglobin: 12.7 g/dL (ref 11.1–15.9)
Immature Grans (Abs): 0 10*3/uL (ref 0.0–0.1)
Immature Granulocytes: 0 %
Lymphocytes Absolute: 2.6 10*3/uL (ref 0.7–3.1)
Lymphs: 37 %
MCH: 28.2 pg (ref 26.6–33.0)
MCHC: 33.5 g/dL (ref 31.5–35.7)
MCV: 84 fL (ref 79–97)
Monocytes Absolute: 0.6 10*3/uL (ref 0.1–0.9)
Monocytes: 8 %
Neutrophils Absolute: 3.7 10*3/uL (ref 1.4–7.0)
Neutrophils: 51 %
Platelets: 383 10*3/uL (ref 150–450)
RBC: 4.51 x10E6/uL (ref 3.77–5.28)
RDW: 13 % (ref 11.7–15.4)
WBC: 7.2 10*3/uL (ref 3.4–10.8)

## 2020-04-30 LAB — INSULIN, RANDOM: INSULIN: 11.7 u[IU]/mL (ref 2.6–24.9)

## 2020-04-30 LAB — COMPREHENSIVE METABOLIC PANEL
ALT: 17 IU/L (ref 0–32)
AST: 16 IU/L (ref 0–40)
Albumin/Globulin Ratio: 1.4 (ref 1.2–2.2)
Albumin: 4.4 g/dL (ref 3.8–4.8)
Alkaline Phosphatase: 81 IU/L (ref 48–121)
BUN/Creatinine Ratio: 15 (ref 9–23)
BUN: 10 mg/dL (ref 6–24)
Bilirubin Total: 0.2 mg/dL (ref 0.0–1.2)
CO2: 25 mmol/L (ref 20–29)
Calcium: 9.4 mg/dL (ref 8.7–10.2)
Chloride: 99 mmol/L (ref 96–106)
Creatinine, Ser: 0.66 mg/dL (ref 0.57–1.00)
GFR calc Af Amer: 119 mL/min/{1.73_m2} (ref >59)
GFR calc non Af Amer: 103 mL/min/{1.73_m2} (ref >59)
Globulin, Total: 3.1 g/dL (ref 1.5–4.5)
Glucose: 92 mg/dL (ref 65–99)
Potassium: 4.2 mmol/L (ref 3.5–5.2)
Sodium: 139 mmol/L (ref 134–144)
Total Protein: 7.5 g/dL (ref 6.0–8.5)

## 2020-04-30 LAB — FOLATE: Folate: 13.7 ng/mL (ref >3.0)

## 2020-04-30 LAB — T3, FREE: T3, Free: 2.4 pg/mL (ref 2.0–4.4)

## 2020-04-30 LAB — TSH: TSH: 1.34 u[IU]/mL (ref 0.450–4.500)

## 2020-04-30 LAB — VITAMIN D 25 HYDROXY (VIT D DEFICIENCY, FRACTURES): Vit D, 25-Hydroxy: 54.3 ng/mL (ref 30.0–100.0)

## 2020-04-30 LAB — VITAMIN B12: Vitamin B-12: 428 pg/mL (ref 232–1245)

## 2020-04-30 LAB — HEMOGLOBIN A1C
Est. average glucose Bld gHb Est-mCnc: 131 mg/dL
Hgb A1c MFr Bld: 6.2 % — ABNORMAL HIGH (ref 4.8–5.6)

## 2020-04-30 LAB — T4, FREE: Free T4: 1.18 ng/dL (ref 0.82–1.77)
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
## 2020-05-04 ENCOUNTER — Other Ambulatory Visit (INDEPENDENT_AMBULATORY_CARE_PROVIDER_SITE_OTHER): Payer: Self-pay | Admitting: Family Medicine

## 2020-05-04 DIAGNOSIS — R7303 Prediabetes: Secondary | ICD-10-CM

## 2020-05-14 ENCOUNTER — Telehealth (INDEPENDENT_AMBULATORY_CARE_PROVIDER_SITE_OTHER): Payer: PRIVATE HEALTH INSURANCE | Admitting: Psychology

## 2020-05-20 ENCOUNTER — Ambulatory Visit (INDEPENDENT_AMBULATORY_CARE_PROVIDER_SITE_OTHER): Payer: PRIVATE HEALTH INSURANCE | Admitting: Family Medicine

## 2020-06-15 ENCOUNTER — Encounter (INDEPENDENT_AMBULATORY_CARE_PROVIDER_SITE_OTHER): Payer: Self-pay | Admitting: Family Medicine

## 2020-06-15 ENCOUNTER — Ambulatory Visit (INDEPENDENT_AMBULATORY_CARE_PROVIDER_SITE_OTHER): Payer: PRIVATE HEALTH INSURANCE | Admitting: Family Medicine

## 2020-06-15 ENCOUNTER — Other Ambulatory Visit: Payer: Self-pay

## 2020-06-15 VITALS — BP 128/72 | HR 78 | Temp 98.1°F | Ht 65.0 in | Wt 213.0 lb

## 2020-06-15 DIAGNOSIS — Z9189 Other specified personal risk factors, not elsewhere classified: Secondary | ICD-10-CM

## 2020-06-15 DIAGNOSIS — Z6835 Body mass index (BMI) 35.0-35.9, adult: Secondary | ICD-10-CM | POA: Diagnosis not present

## 2020-06-15 DIAGNOSIS — R7303 Prediabetes: Secondary | ICD-10-CM

## 2020-06-16 MED ORDER — PHENTERMINE HCL 8 MG PO TABS: 1.0000 | ORAL_TABLET | Freq: Every morning | ORAL | 0 refills | Status: DC

## 2020-06-16 NOTE — Progress Notes (Signed)
Chief Complaint:   OBESITY [name] is here to discuss her progress with her obesity treatment plan along with follow-up of her obesity related diagnoses. [name] is on the Category 1 Plan and states she is following her eating plan approximately 0% of the time. [name] states she is exercising for 0 minutes 0 times per week.  Today's visit was #: 7 Starting weight: 216 lbs Starting date: 01/07/2020 Today's weight: 213 lbs Today's date: 06/15/2020 Total lbs lost to date: 3 lbs Total lbs lost since last in-office visit: 0  Interim History: [name] says she had a counseling assessment - why do I eat?  She discovered the it is not emotionally driven.  Endorses polyphagia.  Assessment/Plan:   1. Prediabetes Goal is HgbA1c < 5.7 and insulin level closer to 5. [name] will continue to work on weight loss, exercise, and decreasing simple carbohydrates to help decrease the risk of diabetes.  Continue metformin.  2. At risk for constipation [name] was given approximately 15 minutes of counseling today regarding prevention of constipation. She was encouraged to increase water and fiber intake.  She is at risk of constipation due to taking phentermine.  Counseling Getting to Good Bowel Health: Your goal is to have one soft bowel movement each day. Drink at least 8 glasses of water each day. Eat plenty of fiber (goal is over 25 grams each day). It is best to get most of your fiber from dietary sources which includes leafy green vegetables, fresh fruit, and whole grains. You may need to add fiber with the help of OTC fiber supplements. These include Metamucil, Citrucel, and Flaxseed. If you are still having trouble, try adding Miralax or Magnesium Citrate. If all of these changes do not work, Cabin crew.  3. Class 2 severe obesity with serious comorbidity and body mass index (BMI) of 35.0 to 35.9 in adult, unspecified obesity type ([place]) Phentermine initiation:  After discussion,  patient would like to start below medication. Expectations, risks, and potential side effects reviewed.   This patient 1) has no evidence of serious cardiovascular disease; 2) does not have serious psychiatric disease or a history of substance abuse; 3) has been informed about weight loss medications that are FDA-approved for long term use whereas phentermine is not.  Patient understands that all anti-obesity medications are contraindicated in pregnancy. Patient denies a history of glaucoma. Patient understands that long-term use of phentermine is considered off-label use of this medication, however, that the Endocrine Society and recent research supports that long-term use of phentermine does not appear to have detrimental health effects if it is used in an appropriate patient. In addition, a 2019 study published in Obesity Journal on [id number] patients concluded that "recommendations to limit phentermine to less than 3 months do not align with current concepts of pharmacologic treatment of obesity", and that "long term phentermine users experience greater weight loss without apparent increases in cardiovascular risk".  We reviewed potential side effects including insomnia, dry mouth, increased heart rate and blood pressure, and increased anxiety. We reviewed reducing caffeine consumption while taking phentermine, especially if the patient is experiencing side effects. The potential risks and benefits of phentermine have been reviewed with the patient, and alternative treatment options were discussed. All questions were answered, and the patient wishes to move forward with this medication  - Phentermine HCl 8 MG TABS; Take 1 tablet by mouth in the morning.  Dispense: 30 tablet; Refill: 0  I have consulted the [place] Controlled Substances Registry  for this patient, and feel the risk/benefit ratio today is favorable for proceeding with this prescription for a controlled substance. The patient understands  monitoring parameters and red flags.   [name] is currently in the action stage of change. As such, her goal is to continue with weight loss efforts. She has agreed to the Category 1 Plan.   Exercise goals: For substantial health benefits, adults should do at least 150 minutes (2 hours and 30 minutes) a week of moderate-intensity, or 75 minutes (1 hour and 15 minutes) a week of vigorous-intensity aerobic physical activity, or an equivalent combination of moderate- and vigorous-intensity aerobic activity. Aerobic activity should be performed in episodes of at least 10 minutes, and preferably, it should be spread throughout the week.  Behavioral modification strategies: increasing lean protein intake.  [name] has agreed to follow-up with our clinic in 3 weeks. She was informed of the importance of frequent follow-up visits to maximize her success with intensive lifestyle modifications for her multiple health conditions.   Objective:   Blood pressure 128/72, pulse 78, temperature 98.1 F (36.7 C), temperature source Oral, height 5\' 5"  (1.651 m), weight 213 lb (96.6 kg), last menstrual period 06/13/2011, SpO2 100 %. Body mass index is 35.45 kg/m.  General: Cooperative, alert, well developed, in no acute distress. HEENT: Conjunctivae and lids unremarkable. Cardiovascular: Regular rhythm.  Lungs: Normal work of breathing. Neurologic: No focal deficits.   Lab Results  Component Value Date   CREATININE 0.66 04/29/2020   BUN 10 04/29/2020   NA 139 04/29/2020   K 4.2 04/29/2020   CL 99 04/29/2020   CO2 25 04/29/2020   Lab Results  Component Value Date   ALT 17 04/29/2020   AST 16 04/29/2020   ALKPHOS 81 04/29/2020   BILITOT 0.2 04/29/2020   Lab Results  Component Value Date   HGBA1C 6.2 (H) 04/29/2020   Lab Results  Component Value Date   INSULIN 11.7 04/29/2020   Lab Results  Component Value Date   TSH 1.340 04/29/2020   Lab Results  Component Value Date   CHOL 218 (H)  04/29/2020   HDL 59 04/29/2020   LDLCALC 148 (H) 04/29/2020   TRIG 63 04/29/2020   CHOLHDL 3.7 04/29/2020   Lab Results  Component Value Date   WBC 7.2 04/29/2020   HGB 12.7 04/29/2020   HCT 37.9 04/29/2020   MCV 84 04/29/2020   PLT 383 04/29/2020   Attestation Statements:   Reviewed by clinician on day of visit: allergies, medications, problem list, medical history, surgical history, family history, social history, and previous encounter notes.  I, Water quality scientist, CMA, am acting as transcriptionist for Briscoe Deutscher, DO  I have reviewed the above documentation for accuracy and completeness, and I agree with the above. Briscoe Deutscher, DO

## 2020-07-13 ENCOUNTER — Ambulatory Visit (INDEPENDENT_AMBULATORY_CARE_PROVIDER_SITE_OTHER): Payer: PRIVATE HEALTH INSURANCE | Admitting: Family Medicine

## 2020-09-15 ENCOUNTER — Ambulatory Visit: Payer: PRIVATE HEALTH INSURANCE | Attending: Internal Medicine

## 2020-09-15 DIAGNOSIS — Z23 Encounter for immunization: Secondary | ICD-10-CM

## 2020-09-15 NOTE — Progress Notes (Signed)
   Covid-19 Vaccination Clinic  Name:  [name]    MRN: [id number] DOB: Mar 12, 1971  09/15/2020  [name] was observed post Covid-19 immunization for 15 minutes without incident. She was provided with Vaccine Information Sheet and instruction to access the V-Safe system.   [name] was instructed to call 911 with any severe reactions post vaccine: Marland Kitchen Difficulty breathing  . Swelling of face and throat  . A fast heartbeat  . A bad rash all over body  . Dizziness and weakness   Immunizations Administered    Name Date Dose VIS Date Route   Pfizer COVID-19 Vaccine 09/15/2020  3:14 PM 0.3 mL 08/05/2020 Intramuscular   Manufacturer: [place]   Lot: [id number]   [place]: [id number]

## 2021-02-18 ENCOUNTER — Encounter: Payer: Self-pay | Admitting: Cardiology

## 2021-02-18 ENCOUNTER — Ambulatory Visit: Payer: PRIVATE HEALTH INSURANCE | Admitting: Cardiology

## 2021-02-18 ENCOUNTER — Other Ambulatory Visit: Payer: Self-pay

## 2021-02-18 VITALS — BP 136/85 | HR 86 | Temp 98.5°F | Resp 16 | Ht 65.0 in | Wt 218.8 lb

## 2021-02-18 DIAGNOSIS — I209 Angina pectoris, unspecified: Secondary | ICD-10-CM

## 2021-02-18 DIAGNOSIS — E7800 Pure hypercholesterolemia, unspecified: Secondary | ICD-10-CM

## 2021-02-18 DIAGNOSIS — R9431 Abnormal electrocardiogram [ECG] [EKG]: Secondary | ICD-10-CM

## 2021-02-18 DIAGNOSIS — E118 Type 2 diabetes mellitus with unspecified complications: Secondary | ICD-10-CM

## 2021-02-18 DIAGNOSIS — I10 Essential (primary) hypertension: Secondary | ICD-10-CM

## 2021-02-18 NOTE — Progress Notes (Signed)
Primary Physician/Referring:  Vernie Shanks, MD  Patient ID: [name], female    DOB: 06-Apr-1971, 50 y.o.   MRN: [id number]  Chief Complaint  Patient presents with  . Abnormal ECG  . New Patient (Initial Visit)    Referred by Kathyrn Lass, MD   HPI:    [name]  is a 50 y.o. African-American female patient with hypertension, hyperlipidemia and who was recently started statins, diabetes mellitus, moderate obesity referred to me for chest discomfort and abnormal EKG.  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  No other associated symptoms, pain is relieved with rest.  Denies any dyspnea, leg edema, PND or orthopnea, denies symptoms of TIA or claudication.  She is diabetic, however had discontinued metformin due to diarrhea, she was prescribed metformin XR which she has not started.  She was also recently started on Jardiance and also after recent labs, was started on Crestor which she is tolerating and has been compliant.  Past Medical History:  Diagnosis Date  . Allergy   . Anemia   . Anemia   . Back pain   . DUB (dysfunctional uterine bleeding)   . Fibroids   . High cholesterol   . Hypertension   . No pertinent past medical history   . Prediabetes   . Smoking history 07/12/11   no hx of  smoking  . Vertigo   . Vitamin D deficiency    Past Surgical History:  Procedure Laterality Date  . ABDOMINAL HYSTERECTOMY    . CESAREAN SECTION  2006, 2008   x 2  . MYOMECTOMY  2003  . TUBAL LIGATION     Family History  Problem Relation Age of Onset  . Heart disease Mother        angina  . Hypertension Mother   . Hyperlipidemia Mother   . Cancer Father        prostate  . Diabetes Father   . Alcoholism Father   . Heart disease Maternal Grandmother   . Diabetes Maternal  Grandfather   . Diabetes Paternal Grandfather     Social History   Tobacco Use  . Smoking status: Former Smoker    Types: Cigarettes  . Smokeless tobacco: Never Used  Substance Use Topics  . Alcohol use: Yes    Alcohol/week: 0.0 standard drinks    Comment: occas   Marital Status: Married  ROS  Review of Systems  Cardiovascular: Positive for chest pain. Negative for dyspnea on exertion and leg swelling.  Musculoskeletal: Positive for back pain (has slip disc).  Gastrointestinal: Negative for melena.   Objective  Blood pressure 136/85, pulse 86, temperature 98.5 F (36.9 C), temperature source Temporal, resp. rate 16, height 5' 5"  (1.651 m), weight 218 lb 12.8 oz (99.2 kg), last menstrual period 06/13/2011, SpO2 97 %. Body mass index is 36.41 kg/m.  Vitals with BMI 02/18/2021 06/15/2020 04/29/2020  Height 5' 5"  5' 5"  5' 5"   Weight 218 lbs 13 oz 213 lbs 211 lbs  BMI 36.41 34.91 79.15  Systolic 056 979 480  Diastolic 85 72 88  Pulse 86 78 77     Physical Exam  Constitutional: No distress.  Moderate obesity  Eyes: Conjunctivae are normal.  Neck: No JVD present.  Cardiovascular: Regular rhythm, normal heart sounds, intact distal pulses and normal  pulses. Exam reveals no gallop.  No murmur heard. Pulmonary/Chest: Effort normal and breath sounds normal. She exhibits no tenderness.  Abdominal: Soft. Bowel sounds are normal.  Musculoskeletal:        General: No edema. Normal range of motion.     Cervical back: Neck supple.  Neurological: She is alert and oriented to person, place, and time.  Skin: Skin is warm.     Laboratory examination:   Recent Labs    04/29/20 1614  NA 139  K 4.2  CL 99  CO2 25  GLUCOSE 92  BUN 10  CREATININE 0.66  CALCIUM 9.4  GFRNONAA 103  GFRAA 119   CrCl cannot be calculated (Patient's most recent lab result is older than the maximum 21 days allowed.).  CMP Latest Ref Rng & Units 04/29/2020 10/07/2015 03/26/2013  Glucose 65 - 99 mg/dL 92  102(H) 95  BUN 6 - 24 mg/dL 10 13 12   Creatinine 0.57 - 1.00 mg/dL 0.66 0.66 0.65  Sodium 134 - 144 mmol/L 139 137 138  Potassium 3.5 - 5.2 mmol/L 4.2 4.4 3.8  Chloride 96 - 106 mmol/L 99 101 101  CO2 20 - 29 mmol/L 25 26 27   Calcium 8.7 - 10.2 mg/dL 9.4 9.2 9.5  Total Protein 6.0 - 8.5 g/dL 7.5 7.7 7.7  Total Bilirubin 0.0 - 1.2 mg/dL 0.2 0.3 0.2(L)  Alkaline Phos 48 - 121 IU/L 81 58 72  AST 0 - 40 IU/L 16 20 20   ALT 0 - 32 IU/L 17 19 18    CBC Latest Ref Rng & Units 04/29/2020 10/07/2015 03/26/2013  WBC 3.4 - 10.8 x10E3/uL 7.2 6.0 7.7  Hemoglobin 11.1 - 15.9 g/dL 12.7 12.0 12.3  Hematocrit 34.0 - 46.6 % 37.9 35.8(L) 41.3  Platelets 150 - 450 x10E3/uL 383 369 -    Lipid Panel Recent Labs    04/29/20 1614  CHOL 218*  TRIG 63  LDLCALC 148*  HDL 59  CHOLHDL 3.7   Lipid Panel     Component Value Date/Time   CHOL 218 (H) 04/29/2020 1614   TRIG 63 04/29/2020 1614   HDL 59 04/29/2020 1614   CHOLHDL 3.7 04/29/2020 1614   CHOLHDL 3.3 10/07/2015 0914   VLDL 16 10/07/2015 0914   LDLCALC 148 (H) 04/29/2020 1614   LABVLDL 11 04/29/2020 1614     HEMOGLOBIN A1C Lab Results  Component Value Date   HGBA1C 6.2 (H) 04/29/2020   TSH Recent Labs    04/29/20 1614  TSH 1.340    External labs:   Labs 02/15/2021:  Vitamin D48.2.  C-reactive protein 13.82.  Total cholesterol 200, triglycerides 63, HDL 48, LDL 140.  A1c 6.4%.  7 glucose 109 mg, BUN 11, creatinine 0.59, potassium 4.3, EGFR 109 mL.  Labs 11/16/2020:  Total cholesterol 205, triglycerides 10/17/2014, HDL 48, LDL 136, non-HDL cholesterol 157.    A1c 6.5%.  Serum glucose 112 mg, BUN 13, creatinine 0.66, EGFR 95/1 and 14 mL, potassium 3.6, CMP otherwise normal.  Medications and allergies  No Known Allergies   Current Outpatient Medications on File Prior to Visit  Medication Sig Dispense Refill  . albuterol (VENTOLIN HFA) 108 (90 Base) MCG/ACT inhaler Inhale 1-2 puffs into the lungs as needed for allergies.     Marland Kitchen amLODipine (NORVASC) 5 MG tablet Take 1 tablet (5 mg total) by mouth daily. 90 tablet 3  . aspirin 81 MG chewable tablet Chew 1 tablet by mouth daily.    Marland Kitchen JARDIANCE 10 MG TABS tablet Take  10 mg by mouth daily.    . meloxicam (MOBIC) 15 MG tablet Take 1 tablet by mouth as needed.    . nitroGLYCERIN (NITROSTAT) 0.3 MG SL tablet Place 0.3 mg under the tongue See admin instructions.    . rosuvastatin (CRESTOR) 20 MG tablet Take 20 mg by mouth at bedtime.     No current facility-administered medications on file prior to visit.     Radiology:   No results found.  Cardiac Studies:   None   EKG:     EKG 02/18/2021: Normal sinus rhythm at rate of 78 bpm, left atrial enlargement, normal axis.  T wave abnormality, cannot exclude anterior ischemia.  Normal QT interval. No significant change from 02/15/2021.  T wave abnormality new compared to 01/07/2020.    Assessment     ICD-10-CM   1. Angina pectoris (HCC)  I20.9 CT CARDIAC SCORING (SELF PAY ONLY)    PCV ECHOCARDIOGRAM COMPLETE    PCV MYOCARDIAL PERFUSION WO LEXISCAN  2. Nonspecific abnormal electrocardiogram (ECG) (EKG)  R94.31 EKG 12-Lead    CT CARDIAC SCORING (SELF PAY ONLY)    PCV ECHOCARDIOGRAM COMPLETE    PCV MYOCARDIAL PERFUSION WO LEXISCAN  3. Essential hypertension  I10   4. Hypercholesteremia  E78.00   5. Controlled type 2 diabetes mellitus with complication, without long-term current use of insulin (HCC)  E11.8      Medications Discontinued During This Encounter  Medication Reason  . metFORMIN (GLUCOPHAGE) 500 MG tablet Error  . Phentermine HCl 8 MG TABS Error    No orders of the defined types were placed in this encounter.  Orders Placed This Encounter  Procedures  . CT CARDIAC SCORING (SELF PAY ONLY)    Standing Status:   Future    Standing Expiration Date:   02/18/2022    Order Specific Question:   Is patient pregnant?    Answer:   No    Order Specific Question:   Preferred imaging location?    Answer:    [facility]  . PCV MYOCARDIAL PERFUSION WO LEXISCAN    Standing Status:   Future    Standing Expiration Date:   04/20/2021  . EKG 12-Lead  . PCV ECHOCARDIOGRAM COMPLETE    Standing Status:   Future    Standing Expiration Date:   02/18/2022   Recommendations:   VIVIANE SEMIDEY is a 50 y.o. African-American female patient with hypertension, hyperlipidemia and who was recently started statins, diabetes mellitus, moderate obesity referred to me for chest discomfort and abnormal EKG.  Her symptoms of exertional chest pain are very suggestive of angina pectoris.  Agree with being aggressive on her statin therapy and also use of nitroglycerin on a as needed basis prescribed by her PCP. Patient instructed not to do heavy lifting, heavy exertional activity, swimming until evaluation is complete.  Patient instructed to call if symptoms worse or to go to the ED for further evaluation.   She also has new EKG abnormalities in the form of T wave inversion in anterior leads that is new from previous.  On appropriate medical therapy, hence I did not make any changes.  I would like to add beta-blocker therapy however would like to wait till we perform stress test, she is already on a vasodilator with amlodipine, continue the same and she is on high intensity high-dose Crestor.  Schedule for a Exercise Nuclear stress test to evaluate for myocardial ischemia. Will schedule for an echocardiogram.  I will also perform coronary calcium score.  Office visit following the work-up/investigations.  Obesity was discussed with the patient.  Patient had previously lost about 30 pounds in weight with health and wellness clinic enrollment but has gained this back.  We discussed different plans of how she could lose weight.  Plant-based diet was encouraged.    Adrian Prows, MD, [facility] 02/18/2021, 12:11 PM Office: [phone]

## 2021-03-02 ENCOUNTER — Other Ambulatory Visit: Payer: Self-pay

## 2021-03-02 ENCOUNTER — Ambulatory Visit: Payer: PRIVATE HEALTH INSURANCE

## 2021-03-02 DIAGNOSIS — I209 Angina pectoris, unspecified: Secondary | ICD-10-CM

## 2021-03-02 DIAGNOSIS — R9431 Abnormal electrocardiogram [ECG] [EKG]: Secondary | ICD-10-CM

## 2021-03-04 ENCOUNTER — Ambulatory Visit
Admission: RE | Admit: 2021-03-04 | Discharge: 2021-03-04 | Disposition: A | Discharge status: Home / Self Care | Payer: PRIVATE HEALTH INSURANCE | Source: Ambulatory Visit | Attending: Cardiology | Admitting: Cardiology

## 2021-03-04 DIAGNOSIS — I209 Angina pectoris, unspecified: Secondary | ICD-10-CM

## 2021-03-04 DIAGNOSIS — R9431 Abnormal electrocardiogram [ECG] [EKG]: Secondary | ICD-10-CM

## 2021-03-04 NOTE — Progress Notes (Signed)
Essentially normal echocardiogram  Echocardiogram 03/02/2021: Normal LV systolic function with visual EF 60-65%. Left ventricle cavity is normal in size. Mild left ventricular hypertrophy. Normal global wall motion. Normal diastolic filling pattern, normal LAP.  Mild (Grade I) mitral regurgitation. Trace tricuspid regurgitation. No evidence of pulmonary hypertension. Mild pulmonic regurgitation. No prior study for comparison.

## 2021-03-05 NOTE — Progress Notes (Signed)
Coronary calcium score 03/05/2021: 1. Coronary calcium score is 0. Study has some technical limitations due to motion artifact but no definite coronary artery calcium. 2. Small lymph nodes in the visualized left axilla with a small subcutaneous nodule in the lateral left lower chest. Findings are nonspecific. Recommend correlation with mammography

## 2021-03-08 ENCOUNTER — Ambulatory Visit: Payer: PRIVATE HEALTH INSURANCE

## 2021-03-08 ENCOUNTER — Other Ambulatory Visit: Payer: Self-pay

## 2021-03-08 DIAGNOSIS — R9431 Abnormal electrocardiogram [ECG] [EKG]: Secondary | ICD-10-CM

## 2021-03-08 DIAGNOSIS — I209 Angina pectoris, unspecified: Secondary | ICD-10-CM

## 2021-03-08 NOTE — Telephone Encounter (Signed)
Thanks TO!! Sent you chat message

## 2021-04-01 NOTE — Progress Notes (Signed)
Primary Physician/Referring:  Vernie Shanks, MD  Patient ID: [name], female    DOB: 24-May-1971, 50 y.o.   MRN: [id number]  Chief Complaint  Patient presents with   Chest Pain   Follow-up    4 weeks    HPI:    [name]  is a 50 y.o. African-American female patient with hypertension, hyperlipidemia and who was recently started statins, diabetes mellitus, moderate obesity.  Originally referred by PCP for evaluation of chest discomfort and abnormal EKG.    Patient has had no recurrence of chest pain since last office visit.  She is also made significant diet and lifestyle modifications since last visit.  Reducing salt intake and cutting meat out of her diet.  She has also increased her physical activity presently walking 30 minutes 3-4 times per week without issue.  Denies chest pain, dyspnea, syncope, near syncope, dizziness, leg edema, PND, orthopnea.  Denies claudication.  Given her symptoms suggestive of angina pectoris at last visit patient underwent echocardiogram and nuclear stress test.  She now presents for follow-up to discuss the results.  Past Medical History:  Diagnosis Date   Allergy    Anemia    Anemia    Back pain    DUB (dysfunctional uterine bleeding)    Fibroids    High cholesterol    Hypertension    No pertinent past medical history    Prediabetes    Smoking history 07/12/11   no hx of  smoking   Vertigo    Vitamin D deficiency    Past Surgical History:  Procedure Laterality Date   ABDOMINAL HYSTERECTOMY     CESAREAN SECTION  2006, 2008   x 2   MYOMECTOMY  2003   TUBAL LIGATION     Family History  Problem Relation Age of Onset   Heart disease Mother        angina   Hypertension Mother    Hyperlipidemia Mother    Cancer Father        prostate   Diabetes Father    Alcoholism Father    Heart disease Maternal Grandmother    Diabetes Maternal Grandfather    Diabetes Paternal Grandfather     Social History   Tobacco  Use   Smoking status: Former    Years: 0.50    Pack years: 0.00    Types: Cigarettes   Smokeless tobacco: Never  Substance Use Topics   Alcohol use: Yes    Alcohol/week: 0.0 standard drinks    Comment: occas   Marital Status: Married  ROS  Review of Systems  Cardiovascular:  Negative for chest pain (resolved), dyspnea on exertion and leg swelling.  Musculoskeletal:  Positive for back pain (has slip disc).  Gastrointestinal:  Negative for melena.  Objective  Blood pressure 130/83, pulse 78, temperature 98.7 F (37.1 C), temperature source Temporal, resp. rate 16, height 5' 5"  (1.651 m), weight 215 lb 12.8 oz (97.9 kg), last menstrual period 06/13/2011, SpO2 98 %. Body mass index is 35.91 kg/m.  Vitals with BMI 04/02/2021 02/18/2021 06/15/2020  Height 5' 5"  5' 5"  5' 5"   Weight 215 lbs 13 oz 218 lbs 13 oz 213 lbs  BMI 35.91 13.24 40.10  Systolic 272 536 644  Diastolic 83 85 72  Pulse 78 86 78     Physical Exam  Constitutional: No distress.  Moderate obesity  Neck: No JVD present.  Cardiovascular: Regular rhythm, normal heart sounds, intact distal pulses and normal pulses. Exam  reveals no gallop.  No murmur heard. Pulmonary/Chest: Effort normal and breath sounds normal. She exhibits no tenderness.  Musculoskeletal:        General: No edema. Normal range of motion.     Cervical back: Neck supple.  Neurological: She is alert and oriented to person, place, and time.  Skin: Skin is warm.    Laboratory examination:   Recent Labs    04/29/20 1614  NA 139  K 4.2  CL 99  CO2 25  GLUCOSE 92  BUN 10  CREATININE 0.66  CALCIUM 9.4  GFRNONAA 103  GFRAA 119   CrCl cannot be calculated (Patient's most recent lab result is older than the maximum 21 days allowed.).  CMP Latest Ref Rng & Units 04/29/2020 10/07/2015 03/26/2013  Glucose 65 - 99 mg/dL 92 102(H) 95  BUN 6 - 24 mg/dL 10 13 12   Creatinine 0.57 - 1.00 mg/dL 0.66 0.66 0.65  Sodium 134 - 144 mmol/L 139 137 138  Potassium  3.5 - 5.2 mmol/L 4.2 4.4 3.8  Chloride 96 - 106 mmol/L 99 101 101  CO2 20 - 29 mmol/L 25 26 27   Calcium 8.7 - 10.2 mg/dL 9.4 9.2 9.5  Total Protein 6.0 - 8.5 g/dL 7.5 7.7 7.7  Total Bilirubin 0.0 - 1.2 mg/dL 0.2 0.3 0.2(L)  Alkaline Phos 48 - 121 IU/L 81 58 72  AST 0 - 40 IU/L 16 20 20   ALT 0 - 32 IU/L 17 19 18    CBC Latest Ref Rng & Units 04/29/2020 10/07/2015 03/26/2013  WBC 3.4 - 10.8 x10E3/uL 7.2 6.0 7.7  Hemoglobin 11.1 - 15.9 g/dL 12.7 12.0 12.3  Hematocrit 34.0 - 46.6 % 37.9 35.8(L) 41.3  Platelets 150 - 450 x10E3/uL 383 369 -    Lipid Panel Recent Labs    04/29/20 1614  CHOL 218*  TRIG 63  LDLCALC 148*  HDL 59  CHOLHDL 3.7   Lipid Panel     Component Value Date/Time   CHOL 218 (H) 04/29/2020 1614   TRIG 63 04/29/2020 1614   HDL 59 04/29/2020 1614   CHOLHDL 3.7 04/29/2020 1614   CHOLHDL 3.3 10/07/2015 0914   VLDL 16 10/07/2015 0914   LDLCALC 148 (H) 04/29/2020 1614   LABVLDL 11 04/29/2020 1614     HEMOGLOBIN A1C Lab Results  Component Value Date   HGBA1C 6.2 (H) 04/29/2020   TSH Recent Labs    04/29/20 1614  TSH 1.340    External labs:   Labs 02/15/2021:  Vitamin D48.2.  C-reactive protein 13.82.  Total cholesterol 200, triglycerides 63, HDL 48, LDL 140.  A1c 6.4%.  7 glucose 109 mg, BUN 11, creatinine 0.59, potassium 4.3, EGFR 109 mL.  Labs 11/16/2020:  Total cholesterol 205, triglycerides 10/17/2014, HDL 48, LDL 136, non-HDL cholesterol 157.    A1c 6.5%.  Serum glucose 112 mg, BUN 13, creatinine 0.66, EGFR 95/1 and 14 mL, potassium 3.6, CMP otherwise normal.  Medications and allergies  No Known Allergies   Current Outpatient Medications on File Prior to Visit  Medication Sig Dispense Refill   albuterol (VENTOLIN HFA) 108 (90 Base) MCG/ACT inhaler Inhale 1-2 puffs into the lungs as needed for allergies.     amLODipine (NORVASC) 5 MG tablet Take 1 tablet (5 mg total) by mouth daily. 90 tablet 3   aspirin 81 MG chewable tablet Chew 1  tablet by mouth daily.     JARDIANCE 10 MG TABS tablet Take 10 mg by mouth daily.     meloxicam (  MOBIC) 15 MG tablet Take 1 tablet by mouth as needed.     nitroGLYCERIN (NITROSTAT) 0.3 MG SL tablet Place 0.3 mg under the tongue See admin instructions.     rosuvastatin (CRESTOR) 20 MG tablet Take 20 mg by mouth at bedtime.     No current facility-administered medications on file prior to visit.     Radiology:   No results found.  Cardiac Studies:   Coronary calcium score 03/05/2021: 1. Coronary calcium score is 0. Study has some technical limitations due to motion artifact but no definite coronary artery calcium. 2. Small lymph nodes in the visualized left axilla with a small subcutaneous nodule in the lateral left lower chest. Findings are nonspecific. Recommend correlation with mammography  Echocardiogram 03/02/2021: Normal LV systolic function with visual EF 60-65%. Left ventricle cavity is normal in size. Mild left ventricular hypertrophy. Normal global wall motion. Normal diastolic filling pattern, normal LAP.  Mild (Grade I) mitral regurgitation. Trace tricuspid regurgitation. No evidence of pulmonary hypertension. Mild pulmonic regurgitation. No prior study for comparison.  Exercise Sestamibi Stress Test 03/08/2021: Normal ECG stress. The patient exercised for 8 minutes and 0 seconds of a Bruce protocol, achieving approximately 10.14 METs. <1 mm upsloping ST segment depression at peak exercise which resolved immediately into recovery. No chest pain. Normal exercise tolerance. Normal BP response. Myocardial perfusion is normal. Gated SPECT imaging of the left ventricle was normal. All segments of left ventricle demonstrated normal wall motion and thickening. TID is normal. Stress LV EF is normal 50%. No previous exam available for comparison. Low risk study.  EKG:   EKG 02/18/2021: Normal sinus rhythm at rate of 78 bpm, left atrial enlargement, normal axis.  T wave  abnormality, cannot exclude anterior ischemia.  Normal QT interval. No significant change from 02/15/2021.  T wave abnormality new compared to 01/07/2020.    Assessment     ICD-10-CM   1. Angina pectoris (HCC)  I20.9     2. Essential hypertension  I10     3. Hypercholesteremia  E78.00        There are no discontinued medications.   No orders of the defined types were placed in this encounter.  No orders of the defined types were placed in this encounter.  Recommendations:   [name] is a 50 y.o. African-American female patient with hypertension, hyperlipidemia and who was recently started statins, diabetes mellitus, moderate obesity.  Originally referred to our office for evaluation of chest discomfort and abnormal EKG.   Given patient's symptoms suggestive of angina pectoris she underwent echocardiogram, nuclear stress test, and coronary calcium score.  Reviewed and discussed results of these tests, details above.  Echocardiogram was normal, stress test low risk, and she has coronary calcium score of 0.  Reassured patient previous episodes of chest discomfort were not related to cardiac etiology.  Rather suspect musculoskeletal etiology, particularly given improvement without recurrence since last visit.  In regard to hyperlipidemia and hypertension will defer further management to PCP.   Encourage patient to continue with diet and lifestyle modifications.  Follow-up on an as-needed basis.   Alethia Berthold, PA-C 04/02/2021, 12:53 PM Office: [phone]

## 2021-04-02 ENCOUNTER — Encounter: Payer: Self-pay | Admitting: Cardiology

## 2021-04-02 ENCOUNTER — Ambulatory Visit: Payer: PRIVATE HEALTH INSURANCE | Admitting: Cardiology

## 2021-04-02 ENCOUNTER — Other Ambulatory Visit: Payer: Self-pay

## 2021-04-02 VITALS — BP 130/83 | HR 78 | Temp 98.7°F | Resp 16 | Ht 65.0 in | Wt 215.8 lb

## 2021-04-02 DIAGNOSIS — I10 Essential (primary) hypertension: Secondary | ICD-10-CM

## 2021-04-02 DIAGNOSIS — I209 Angina pectoris, unspecified: Secondary | ICD-10-CM

## 2021-04-02 DIAGNOSIS — E7800 Pure hypercholesterolemia, unspecified: Secondary | ICD-10-CM

## 2022-03-17 ENCOUNTER — Ambulatory Visit: Payer: PRIVATE HEALTH INSURANCE | Admitting: Cardiology

## 2022-04-27 ENCOUNTER — Encounter: Payer: Self-pay | Admitting: Cardiology

## 2022-04-27 ENCOUNTER — Ambulatory Visit: Payer: PRIVATE HEALTH INSURANCE | Admitting: Cardiology

## 2022-04-27 VITALS — BP 128/83 | HR 74 | Temp 98.7°F | Resp 16 | Ht 65.0 in | Wt 219.4 lb

## 2022-04-27 DIAGNOSIS — R9431 Abnormal electrocardiogram [ECG] [EKG]: Secondary | ICD-10-CM

## 2022-04-27 DIAGNOSIS — E7800 Pure hypercholesterolemia, unspecified: Secondary | ICD-10-CM

## 2022-04-27 DIAGNOSIS — E118 Type 2 diabetes mellitus with unspecified complications: Secondary | ICD-10-CM

## 2022-04-27 DIAGNOSIS — I10 Essential (primary) hypertension: Secondary | ICD-10-CM

## 2022-04-27 MED ORDER — ROSUVASTATIN CALCIUM 20 MG PO TABS: 20.0000 mg | ORAL_TABLET | Freq: Every day | ORAL | 3 refills | Status: DC

## 2022-04-27 NOTE — Progress Notes (Signed)
Primary Physician/Referring:  Vernie Shanks, MD (Inactive)  Patient ID: [name], female    DOB: [birth year], 51 y.o.   MRN: [id number]  Chief Complaint  Patient presents with   Chest Pain   Follow-up   HPI:    [name]  is a 51 y.o. African-American female patient with hypertension, hyperlipidemia, diabetes mellitus, moderate obesity presents for follow-up of chest discomfort and abnormal EKG, in May 2022 she had undergone echocardiogram and stress test which were low risk and coronary calcium score was 0.  She has not had any further episodes of exertional chest pain, dyspnea, leg edema, PND or orthopnea, denies symptoms of TIA or claudication. Past Medical History:  Diagnosis Date   Allergy    Anemia    Anemia    Back pain    DUB (dysfunctional uterine bleeding)    Fibroids    High cholesterol    Hypertension    No pertinent past medical history    Prediabetes    Smoking history 07/12/11   no hx of  smoking   Vertigo    Vitamin D deficiency    Past Surgical History:  Procedure Laterality Date   ABDOMINAL HYSTERECTOMY     CESAREAN SECTION  2006, 2008   x 2   MYOMECTOMY  2003   TUBAL LIGATION     Family History  Problem Relation Age of Onset   Heart disease Mother        angina   Hypertension Mother    Hyperlipidemia Mother    Cancer Father        prostate   Diabetes Father    Alcoholism Father    Heart disease Maternal Grandmother    Diabetes Maternal Grandfather    Diabetes Paternal Grandfather     Social History   Tobacco Use   Smoking status: Former    Packs/day: 0.50    Years: 0.50    Total pack years: 0.25    Types: Cigarettes   Smokeless tobacco: Never  Substance Use Topics   Alcohol use: Yes    Alcohol/week: 0.0 standard drinks of alcohol    Comment: occas   Marital Status: Married  ROS  Review of Systems  Cardiovascular:  Negative for chest pain, dyspnea on exertion and leg swelling.  Musculoskeletal:  Positive  for back pain (has slip disc).  Gastrointestinal:  Negative for melena.   Objective  Blood pressure 128/83, pulse 74, temperature 98.7 F (37.1 C), temperature source Temporal, resp. rate 16, height 5' 5"  (1.651 m), weight 219 lb 6.4 oz (99.5 kg), last menstrual period 06/13/2011, SpO2 99 %. Body mass index is 36.51 kg/m.     04/27/2022    3:19 PM 04/02/2021    8:50 AM 02/18/2021   10:52 AM  Vitals with BMI  Height 5' 5"  5' 5"  5' 5"   Weight 219 lbs 6 oz 215 lbs 13 oz 218 lbs 13 oz  BMI 36.51 12.24 82.50  Systolic 037 048 889  Diastolic 83 83 85  Pulse 74 78 86     Physical Exam  Neck: No JVD present.  Cardiovascular: Regular rhythm, normal heart sounds, intact distal pulses and normal pulses. Exam reveals no gallop.  No murmur heard. Pulmonary/Chest: Effort normal and breath sounds normal.  Abdominal: Soft. Bowel sounds are normal.  Musculoskeletal:        General: No edema.    Laboratory examination:   External labs:   Labs 12/10/2021:   Total cholesterol 200, triglycerides  65, HDL 56, LDL 132.  Non-HDL cholesterol 144.  Vitamin D 33.2. A1c 6.3%.  BUN 15, creatinine 0.63, EGFR 107 mL, potassium 4.4, LFTs normal.  Hb 12.5/HCT 38.5, platelets 360, normal indicis.  Labs 02/15/2021:  Total cholesterol 200, triglycerides 63, HDL 48, LDL 140.  Medications and allergies   Allergies  Allergen Reactions   Jardiance [Empagliflozin] Other (See Comments)    Perineal infection     Current Outpatient Medications:    albuterol (VENTOLIN HFA) 108 (90 Base) MCG/ACT inhaler, Inhale 1-2 puffs into the lungs as needed for allergies., Disp: , Rfl:    amLODipine (NORVASC) 5 MG tablet, Take 1 tablet (5 mg total) by mouth daily., Disp: 90 tablet, Rfl: 3   meloxicam (MOBIC) 15 MG tablet, Take 1 tablet by mouth as needed., Disp: , Rfl:    nitroGLYCERIN (NITROSTAT) 0.3 MG SL tablet, Place 0.3 mg under the tongue See admin instructions., Disp: , Rfl:    rosuvastatin (CRESTOR) 20 MG  tablet, Take 1 tablet (20 mg total) by mouth daily. Mon, Wed, Fri, Disp: 90 tablet, Rfl: 3   Radiology:   No results found.  Cardiac Studies:   Coronary calcium score 03/05/2021: 1. Coronary calcium score is 0. Study has some technical limitations due to motion artifact but no definite coronary artery calcium. 2. Small lymph nodes in the visualized left axilla with a small subcutaneous nodule in the lateral left lower chest. Findings are nonspecific. Recommend correlation with mammography.  PCV MYOCARDIAL PERFUSION WO LEXISCAN 03/08/2021  Narrative Exercise Sestamibi Stress Test 03/08/2021: Normal ECG stress. The patient exercised for 8 minutes and 0 seconds of a Bruce protocol, achieving approximately 10.14 METs. <1 mm upsloping ST segment depression at peak exercise which resolved immediately into recovery. No chest pain. NOrmal exercise tolerance. Normal BP response. Myocardial perfusion is normal. Gated SPECT imaging of the left ventricle was normal. All segments of left ventricle demonstrated normal wall motion and thickening. TID is normal. Stress LV EF is normal 50%. No previous exam available for comparison. Low risk study.   PCV ECHOCARDIOGRAM COMPLETE 03/02/2021  Narrative Echocardiogram 03/02/2021: Normal LV systolic function with visual EF 60-65%. Left ventricle cavity is normal in size. Mild left ventricular hypertrophy. Normal global wall motion. Normal diastolic filling pattern, normal LAP. Mild (Grade I) mitral regurgitation. Trace tricuspid regurgitation. No evidence of pulmonary hypertension. Mild pulmonic regurgitation. No prior study for comparison.    EKG:   EKG 04/27/2022: Normal sinus rhythm at a rate of 75 bpm, normal axis, nonspecific T abnormality in the inferior leads and anterior leads.  No significant change from 02/18/2021.    Assessment     ICD-10-CM   1. Nonspecific abnormal electrocardiogram (ECG) (EKG)  R94.31 EKG 12-Lead    2. Essential  hypertension  I10     3. Hypercholesteremia  E78.00 rosuvastatin (CRESTOR) 20 MG tablet    4. Controlled type 2 diabetes mellitus with complication, without long-term current use of insulin (HCC)  E11.8        Medications Discontinued During This Encounter  Medication Reason   aspirin 81 MG chewable tablet    JARDIANCE 10 MG TABS tablet    rosuvastatin (CRESTOR) 20 MG tablet Reorder    Meds ordered this encounter  Medications   rosuvastatin (CRESTOR) 20 MG tablet    Sig: Take 1 tablet (20 mg total) by mouth daily. Mon, Wed, Fri    Dispense:  90 tablet    Refill:  3   Orders Placed This Encounter  Procedures   EKG 12-Lead   Recommendations:   [name] is a 51 y.o. African-American female patient with hypertension, hyperlipidemia, diabetes mellitus, moderate obesity presents for follow-up of chest discomfort and abnormal EKG, in May 2022 she had undergone echocardiogram and stress test which were low risk and coronary calcium score was 0.  She has not had any further episodes of exertional chest pain.  I reviewed Her labs, LDL is not at goal.  She is presently taking Crestor 20 mg every other day, will increase it to once every day.  If LDL is not closer to <70 in spite of coronary calcium score being 0, would consider addition of Zetia 10 mg daily in view of diabetes status.  EKG abnormalities probably related to hypertension.  No changes from prior EKG.  She remained stable, no clinical evidence of heart failure, no recurrence of angina like symptoms, I will see her back on a as needed basis.  With regard to diabetes mellitus, she could not tolerate Jardiance due to perineal infection and irritation, Rybelsus would be a great drug of choice as well for weight loss and diabetes.  She will discuss this with her PCP.Adrian Prows, MD, [facility] 04/27/2022, 3:51 PM Office: [phone]

## 2022-05-25 ENCOUNTER — Encounter (INDEPENDENT_AMBULATORY_CARE_PROVIDER_SITE_OTHER): Payer: Self-pay

## 2023-01-28 ENCOUNTER — Ambulatory Visit
Admission: EM | Admit: 2023-01-28 | Discharge: 2023-01-28 | Disposition: A | Discharge status: Home / Self Care | Payer: PRIVATE HEALTH INSURANCE

## 2023-12-29 ENCOUNTER — Encounter: Payer: Self-pay | Admitting: Family Medicine

## 2023-12-29 ENCOUNTER — Ambulatory Visit: Payer: PRIVATE HEALTH INSURANCE | Admitting: Family Medicine

## 2023-12-29 VITALS — BP 137/87 | HR 98 | Temp 83.0°F | Resp 16 | Ht 65.0 in | Wt 215.0 lb

## 2023-12-29 DIAGNOSIS — Z7689 Persons encountering health services in other specified circumstances: Secondary | ICD-10-CM

## 2023-12-29 DIAGNOSIS — E66812 Obesity, class 2: Secondary | ICD-10-CM | POA: Diagnosis not present

## 2023-12-29 DIAGNOSIS — L659 Nonscarring hair loss, unspecified: Secondary | ICD-10-CM | POA: Diagnosis not present

## 2023-12-29 DIAGNOSIS — E6609 Other obesity due to excess calories: Secondary | ICD-10-CM | POA: Diagnosis not present

## 2023-12-29 DIAGNOSIS — Z6835 Body mass index (BMI) 35.0-35.9, adult: Secondary | ICD-10-CM

## 2023-12-29 MED ORDER — MINOXIDIL FOR WOMEN 2 % EX SOLN: Freq: Two times a day (BID) | CUTANEOUS | 2 refills | Status: DC

## 2023-12-29 NOTE — Progress Notes (Signed)
 New Patient Office Visit  Subjective    Patient ID: [name], female    DOB: Jun 16, 1971  Age: 53 y.o. MRN: [id number]  CC:  Chief Complaint  Patient presents with   Establish Care    HPI [name] presents to establish care and for complaint of hair loss and thinning, particularly in the crown of her head. This has been happening for several months.    Outpatient Encounter Medications as of 12/29/2023  Medication Sig   amLODipine (NORVASC) 5 MG tablet Take 1 tablet (5 mg total) by mouth daily.   minoxidil (MINOXIDIL FOR WOMEN) 2 % external solution Apply topically 2 (two) times daily.   albuterol (VENTOLIN HFA) 108 (90 Base) MCG/ACT inhaler Inhale 1-2 puffs into the lungs as needed for allergies. (Patient not taking: Reported on 12/29/2023)   meloxicam (MOBIC) 15 MG tablet Take 1 tablet by mouth as needed. (Patient not taking: Reported on 12/29/2023)   [DISCONTINUED] nitroGLYCERIN (NITROSTAT) 0.3 MG SL tablet Place 0.3 mg under the tongue See admin instructions. (Patient not taking: Reported on 12/29/2023)   [DISCONTINUED] rosuvastatin (CRESTOR) 20 MG tablet Take 1 tablet (20 mg total) by mouth daily. Mon, Wed, Fri   No facility-administered encounter medications on file as of 12/29/2023.    Past Medical History:  Diagnosis Date   Allergy    Anemia    Anemia    Back pain    DUB (dysfunctional uterine bleeding)    Fibroids    High cholesterol    Hypertension    No pertinent past medical history    Prediabetes    Smoking history 07/12/11   no hx of  smoking   Vertigo    Vitamin D deficiency     Past Surgical History:  Procedure Laterality Date   ABDOMINAL HYSTERECTOMY     CESAREAN SECTION  2006, 2008   x 2   MYOMECTOMY  2003   TUBAL LIGATION      Family History  Problem Relation Age of Onset   Heart disease Mother        angina   Hypertension Mother    Hyperlipidemia Mother    Cancer Father        prostate   Diabetes Father     Alcoholism Father    Heart disease Maternal Grandmother    Diabetes Maternal Grandfather    Diabetes Paternal Grandfather     Social History   Socioeconomic History   Marital status: Married    Spouse name: Not on file   Number of children: 2   Years of education: Not on file   Highest education level: Not on file  Occupational History   Occupation: MEDICAL RECORDS MANAGER  Tobacco Use   Smoking status: Former    Current packs/day: 0.50    Average packs/day: 0.5 packs/day for 0.5 years (0.3 ttl pk-yrs)    Types: Cigarettes   Smokeless tobacco: Never  Vaping Use   Vaping status: Never Used  Substance and Sexual Activity   Alcohol use: Yes    Alcohol/week: 0.0 standard drinks of alcohol    Comment: occas   Drug use: No   Sexual activity: Yes    Birth control/protection: Surgical  Other Topics Concern   Not on file  Social History Narrative   Married   Education: Automotive engineer   Exercise: No   Social Drivers of Corporate investment banker Strain: Not on file  Food Insecurity: Not on file  Transportation Needs: Not on  file  Physical Activity: Not on file  Stress: Not on file  Social Connections: Not on file  Intimate Partner Violence: Not on file    Review of Systems  All other systems reviewed and are negative.       Objective   BP 137/87   Pulse 98   Temp (!) 83 F (28.3 C) (Oral)   Resp 16   Ht 5\' 5"  (1.651 m)   Wt 215 lb (97.5 kg)   LMP 06/13/2011   SpO2 95%   BMI 35.78 kg/m   Physical Exam Vitals and nursing note reviewed.  Constitutional:      General: She is not in acute distress. HENT:     Head: Hair is abnormal (Thinning - particulary in crown area of head).  Cardiovascular:     Rate and Rhythm: Normal rate and regular rhythm.  Pulmonary:     Effort: Pulmonary effort is normal.     Breath sounds: Normal breath sounds.  Neurological:     General: No focal deficit present.     Mental Status: She is alert and oriented to person, place,  and time.         Assessment & Plan:   Hair loss  Class 2 obesity due to excess calories without serious comorbidity with body mass index (BMI) of 35.0 to 35.9 in adult  Encounter to establish care  Other orders -     Minoxidil for Women; Apply topically 2 (two) times daily.  Dispense: 60 mL; Refill: 2     Return in about 6 weeks (around 02/09/2024) for physical.   Tommie Raymond, MD

## 2023-12-29 NOTE — Progress Notes (Signed)
 Patient is here to established care with provider. ~health hx address ~care gaps address  Better understanding of menopause and how to manage it. Managing good health.

## 2024-02-09 ENCOUNTER — Ambulatory Visit (INDEPENDENT_AMBULATORY_CARE_PROVIDER_SITE_OTHER): Payer: PRIVATE HEALTH INSURANCE | Admitting: Family Medicine

## 2024-02-09 ENCOUNTER — Encounter: Payer: Self-pay | Admitting: Family Medicine

## 2024-02-09 VITALS — BP 131/84 | HR 83 | Ht 65.0 in | Wt 215.8 lb

## 2024-02-09 DIAGNOSIS — Z0000 Encounter for general adult medical examination without abnormal findings: Secondary | ICD-10-CM | POA: Diagnosis not present

## 2024-02-09 DIAGNOSIS — Z130 Encounter for screening for diseases of the blood and blood-forming organs and certain disorders involving the immune mechanism: Secondary | ICD-10-CM | POA: Diagnosis not present

## 2024-02-09 DIAGNOSIS — Z13220 Encounter for screening for lipoid disorders: Secondary | ICD-10-CM

## 2024-02-09 DIAGNOSIS — Z1329 Encounter for screening for other suspected endocrine disorder: Secondary | ICD-10-CM | POA: Diagnosis not present

## 2024-02-09 DIAGNOSIS — Z13228 Encounter for screening for other metabolic disorders: Secondary | ICD-10-CM

## 2024-02-09 DIAGNOSIS — Z1159 Encounter for screening for other viral diseases: Secondary | ICD-10-CM

## 2024-02-09 DIAGNOSIS — Z114 Encounter for screening for human immunodeficiency virus [HIV]: Secondary | ICD-10-CM

## 2024-02-09 NOTE — Progress Notes (Signed)
 Established Patient Office Visit  Subjective    Patient ID: [name], female    DOB: 09-25-1971  Age: 53 y.o. MRN: [id number]  CC:  Chief Complaint  Patient presents with   Annual Exam    HPI [name] presents for routine annual exam. Patient denies acute complaints.   Outpatient Encounter Medications as of 02/09/2024  Medication Sig   amLODipine  (NORVASC ) 5 MG tablet Take 1 tablet (5 mg total) by mouth daily.   albuterol  (VENTOLIN  HFA) 108 (90 Base) MCG/ACT inhaler Inhale 1-2 puffs into the lungs as needed for allergies. (Patient not taking: Reported on 12/29/2023)   meloxicam  (MOBIC ) 15 MG tablet Take 1 tablet by mouth as needed. (Patient not taking: Reported on 12/29/2023)   minoxidil  (MINOXIDIL  FOR WOMEN) 2 % external solution Apply topically 2 (two) times daily.   No facility-administered encounter medications on file as of 02/09/2024.    Past Medical History:  Diagnosis Date   Allergy    Anemia    Anemia    Back pain    DUB (dysfunctional uterine bleeding)    Fibroids    High cholesterol    Hypertension    No pertinent past medical history    Prediabetes    Smoking history 07/12/11   no hx of  smoking   Vertigo    Vitamin D  deficiency     Past Surgical History:  Procedure Laterality Date   ABDOMINAL HYSTERECTOMY     CESAREAN SECTION  2006, 2008   x 2   MYOMECTOMY  2003   TUBAL LIGATION      Family History  Problem Relation Age of Onset   Heart disease Mother        angina   Hypertension Mother    Hyperlipidemia Mother    Cancer Father        prostate   Diabetes Father    Alcoholism Father    Heart disease Maternal Grandmother    Diabetes Maternal Grandfather    Diabetes Paternal Grandfather     Social History   Socioeconomic History   Marital status: Married    Spouse name: Not on file   Number of children: 2   Years of education: Not on file   Highest education level: Not on file  Occupational History    Occupation: MEDICAL RECORDS MANAGER  Tobacco Use   Smoking status: Former    Current packs/day: 0.50    Average packs/day: 0.5 packs/day for 0.5 years (0.3 ttl pk-yrs)    Types: Cigarettes   Smokeless tobacco: Never  Vaping Use   Vaping status: Never Used  Substance and Sexual Activity   Alcohol use: Yes    Alcohol/week: 0.0 standard drinks of alcohol    Comment: occas   Drug use: No   Sexual activity: Yes    Birth control/protection: Surgical  Other Topics Concern   Not on file  Social History Narrative   Married   Education: Automotive engineer   Exercise: No   Social Drivers of Corporate investment banker Strain: Not on file  Food Insecurity: Not on file  Transportation Needs: Not on file  Physical Activity: Not on file  Stress: Not on file  Social Connections: Not on file  Intimate Partner Violence: Not on file    Review of Systems  All other systems reviewed and are negative.       Objective    BP 131/84 (BP Location: Left Arm, Patient Position: Sitting, Cuff Size: Normal)  Pulse 83   Ht 5\' 5"  (1.651 m)   Wt 215 lb 12.8 oz (97.9 kg)   LMP 06/13/2011   SpO2 97%   BMI 35.91 kg/m   Physical Exam Vitals and nursing note reviewed.  Constitutional:      General: She is not in acute distress.    Appearance: She is obese.  HENT:     Head: Normocephalic and atraumatic.     Right Ear: Tympanic membrane, ear canal and external ear normal.     Left Ear: Tympanic membrane, ear canal and external ear normal.     Nose: Nose normal.     Mouth/Throat:     Mouth: Mucous membranes are moist.     Pharynx: Oropharynx is clear.  Eyes:     Conjunctiva/sclera: Conjunctivae normal.     Pupils: Pupils are equal, round, and reactive to light.  Neck:     Thyroid : No thyromegaly.  Cardiovascular:     Rate and Rhythm: Normal rate and regular rhythm.     Heart sounds: Normal heart sounds. No murmur heard. Pulmonary:     Effort: Pulmonary effort is normal. No respiratory distress.      Breath sounds: Normal breath sounds.  Abdominal:     General: There is no distension.     Palpations: Abdomen is soft. There is no mass.     Tenderness: There is no abdominal tenderness.  Musculoskeletal:        General: Normal range of motion.     Cervical back: Normal range of motion and neck supple.  Skin:    General: Skin is warm and dry.  Neurological:     General: No focal deficit present.     Mental Status: She is alert and oriented to person, place, and time.  Psychiatric:        Mood and Affect: Mood normal.        Behavior: Behavior normal.         Assessment & Plan:   Annual physical exam -     CMP14+EGFR  Screening for deficiency anemia -     CBC with Differential/Platelet  Screening for lipid disorders -     Lipid panel  Screening for endocrine/metabolic/immunity disorders -     Hemoglobin A1c -     VITAMIN D  25 Hydroxy (Vit-D Deficiency, Fractures)  Screening for HIV (human immunodeficiency virus) -     HIV Antibody (routine testing w rflx)  Need for hepatitis C screening test -     Hepatitis C antibody     Return in about 6 months (around 08/10/2024) for follow up, chronic med issues.   Arlo Lama, MD

## 2024-02-11 LAB — CMP14+EGFR
ALT: 24 IU/L (ref 0–32)
AST: 22 IU/L (ref 0–40)
Albumin: 4.2 g/dL (ref 3.8–4.9)
Alkaline Phosphatase: 98 IU/L (ref 44–121)
BUN/Creatinine Ratio: 18 (ref 9–23)
BUN: 12 mg/dL (ref 6–24)
Bilirubin Total: 0.3 mg/dL (ref 0.0–1.2)
CO2: 22 mmol/L (ref 20–29)
Calcium: 9.4 mg/dL (ref 8.7–10.2)
Chloride: 105 mmol/L (ref 96–106)
Creatinine, Ser: 0.66 mg/dL (ref 0.57–1.00)
Globulin, Total: 3.3 g/dL (ref 1.5–4.5)
Glucose: 131 mg/dL — ABNORMAL HIGH (ref 70–99)
Potassium: 3.9 mmol/L (ref 3.5–5.2)
Sodium: 143 mmol/L (ref 134–144)
Total Protein: 7.5 g/dL (ref 6.0–8.5)
eGFR: 105 mL/min/{1.73_m2} (ref >59)

## 2024-02-11 LAB — CBC WITH DIFFERENTIAL/PLATELET
Basophils Absolute: 0.1 10*3/uL (ref 0.0–0.2)
Basos: 1 %
EOS (ABSOLUTE): 0.2 10*3/uL (ref 0.0–0.4)
Eos: 3 %
Hematocrit: 36 % (ref 34.0–46.6)
Hemoglobin: 11.6 g/dL (ref 11.1–15.9)
Immature Grans (Abs): 0 10*3/uL (ref 0.0–0.1)
Immature Granulocytes: 0 %
Lymphocytes Absolute: 2 10*3/uL (ref 0.7–3.1)
Lymphs: 36 %
MCH: 27 pg (ref 26.6–33.0)
MCHC: 32.2 g/dL (ref 31.5–35.7)
MCV: 84 fL (ref 79–97)
Monocytes Absolute: 0.5 10*3/uL (ref 0.1–0.9)
Monocytes: 9 %
Neutrophils Absolute: 2.8 10*3/uL (ref 1.4–7.0)
Neutrophils: 51 %
Platelets: 372 10*3/uL (ref 150–450)
RBC: 4.3 x10E6/uL (ref 3.77–5.28)
RDW: 12.6 % (ref 11.7–15.4)
WBC: 5.6 10*3/uL (ref 3.4–10.8)

## 2024-02-11 LAB — HEMOGLOBIN A1C
Est. average glucose Bld gHb Est-mCnc: 157 mg/dL
Hgb A1c MFr Bld: 7.1 % — ABNORMAL HIGH (ref 4.8–5.6)

## 2024-02-11 LAB — LIPID PANEL
Chol/HDL Ratio: 4.4 ratio (ref 0.0–4.4)
Cholesterol, Total: 216 mg/dL — ABNORMAL HIGH (ref 100–199)
HDL: 49 mg/dL (ref >39)
LDL Chol Calc (NIH): 153 mg/dL — ABNORMAL HIGH (ref 0–99)
Triglycerides: 76 mg/dL (ref 0–149)
VLDL Cholesterol Cal: 14 mg/dL (ref 5–40)

## 2024-02-11 LAB — HIV ANTIBODY (ROUTINE TESTING W REFLEX): HIV Screen 4th Generation wRfx: NONREACTIVE

## 2024-02-11 LAB — HEPATITIS C ANTIBODY: Hep C Virus Ab: NONREACTIVE

## 2024-02-11 LAB — VITAMIN D 25 HYDROXY (VIT D DEFICIENCY, FRACTURES): Vit D, 25-Hydroxy: 47.1 ng/mL (ref 30.0–100.0)

## 2024-02-27 ENCOUNTER — Encounter: Payer: Self-pay | Admitting: Family Medicine

## 2024-02-27 ENCOUNTER — Ambulatory Visit: Payer: Self-pay | Admitting: Family Medicine

## 2024-02-27 MED ORDER — METFORMIN HCL ER 500 MG PO TB24: 500.0000 mg | ORAL_TABLET | Freq: Every day | ORAL | 0 refills | Status: DC

## 2024-03-05 ENCOUNTER — Encounter: Payer: Self-pay | Admitting: Family Medicine

## 2024-03-06 ENCOUNTER — Other Ambulatory Visit: Payer: Self-pay | Admitting: Family Medicine

## 2024-03-06 MED ORDER — MELOXICAM 15 MG PO TABS: 15.0000 mg | ORAL_TABLET | ORAL | 0 refills | Status: DC | PRN

## 2024-04-02 ENCOUNTER — Encounter: Payer: Self-pay | Admitting: Family Medicine

## 2024-04-04 ENCOUNTER — Other Ambulatory Visit: Payer: Self-pay | Admitting: Family Medicine

## 2024-04-04 DIAGNOSIS — I10 Essential (primary) hypertension: Secondary | ICD-10-CM

## 2024-04-05 ENCOUNTER — Other Ambulatory Visit: Payer: Self-pay | Admitting: Family Medicine

## 2024-04-05 DIAGNOSIS — I10 Essential (primary) hypertension: Secondary | ICD-10-CM

## 2024-04-05 MED ORDER — AMLODIPINE BESYLATE 5 MG PO TABS: 5.0000 mg | ORAL_TABLET | Freq: Every day | ORAL | 0 refills | Status: DC

## 2024-05-09 ENCOUNTER — Other Ambulatory Visit: Payer: Self-pay | Admitting: Family Medicine

## 2024-05-28 ENCOUNTER — Other Ambulatory Visit: Payer: Self-pay | Admitting: Family Medicine

## 2024-06-20 ENCOUNTER — Other Ambulatory Visit: Payer: Self-pay | Admitting: Family Medicine

## 2024-07-04 ENCOUNTER — Other Ambulatory Visit: Payer: Self-pay | Admitting: Family Medicine

## 2024-07-04 DIAGNOSIS — I10 Essential (primary) hypertension: Secondary | ICD-10-CM

## 2024-07-04 NOTE — Telephone Encounter (Signed)
Pt scheduled in Dec.

## 2024-09-02 ENCOUNTER — Other Ambulatory Visit: Payer: Self-pay | Admitting: Family Medicine

## 2024-09-02 DIAGNOSIS — I10 Essential (primary) hypertension: Secondary | ICD-10-CM

## 2024-09-02 NOTE — Telephone Encounter (Unsigned)
 Copied from CRM #[id number]. Topic: Clinical - Medication Refill >> Sep 02, 2024  9:59 AM Selinda RAMAN wrote: Medication: amLODipine  (NORVASC ) 5 MG tablet  Has the patient contacted their pharmacy? Yes   This is the patient's preferred pharmacy:   [facility] - [place], [place] - [address]  Phone: [phone] Fax: [phone]    Is this the correct pharmacy for this prescription? Yes If no, delete pharmacy and type the correct one.   Has the prescription been filled recently? No  Is the patient out of the medication? Yes she has been out for about 2 weeks  Has the patient been seen for an appointment in the last year OR does the patient have an upcoming appointment? Yes  Can we respond through MyChart? Yes  The patient states her insurance will not cover a full 90 day supply and they only will cover half the amount or a 45 day supply. Please assist patient further

## 2024-09-04 MED ORDER — AMLODIPINE BESYLATE 5 MG PO TABS: 5.0000 mg | ORAL_TABLET | Freq: Every day | ORAL | 0 refills | Status: AC

## 2024-09-04 NOTE — Telephone Encounter (Signed)
 Requested Prescriptions  Pending Prescriptions Disp Refills   amLODipine  (NORVASC ) 5 MG tablet 90 tablet 0    Sig: Take 1 tablet (5 mg total) by mouth daily.     Cardiovascular: Calcium  Channel Blockers 2 Failed - 09/04/2024 11:39 AM      Failed - Valid encounter within last 6 months    Recent Outpatient Visits           6 months ago Annual physical exam   [facility], [place]   8 months ago Hair loss    [facility], [place]   8 years ago Annual physical exam   [facility] [place], [place], [place]   9 years ago Cough   [facility], [place], [place]   9 years ago Sciatic neuritis, unspecified laterality   [facility], Juliane RAMAN, MD              Passed - Last BP in normal range    BP Readings from Last 1 Encounters:  02/09/24 131/84         Passed - Last Heart Rate in normal range    Pulse Readings from Last 1 Encounters:  02/09/24 83

## 2024-10-03 ENCOUNTER — Ambulatory Visit (INDEPENDENT_AMBULATORY_CARE_PROVIDER_SITE_OTHER): Payer: PRIVATE HEALTH INSURANCE | Admitting: Family Medicine

## 2024-10-03 VITALS — BP 118/77 | HR 90 | Ht 65.0 in | Wt 218.2 lb

## 2024-10-03 DIAGNOSIS — Z7689 Persons encountering health services in other specified circumstances: Secondary | ICD-10-CM

## 2024-10-03 DIAGNOSIS — I10 Essential (primary) hypertension: Secondary | ICD-10-CM

## 2024-10-03 DIAGNOSIS — E6609 Other obesity due to excess calories: Secondary | ICD-10-CM

## 2024-10-03 DIAGNOSIS — Z6836 Body mass index (BMI) 36.0-36.9, adult: Secondary | ICD-10-CM

## 2024-10-03 DIAGNOSIS — E66812 Obesity, class 2: Secondary | ICD-10-CM

## 2024-10-03 MED ORDER — PHENTERMINE HCL 37.5 MG PO TABS: 37.5000 mg | ORAL_TABLET | Freq: Every day | ORAL | 0 refills | Status: DC

## 2024-10-03 NOTE — Progress Notes (Unsigned)
 Established Patient Office Visit  Subjective    Patient ID: [name], female    DOB: [birth year]  Age: 53 y.o. MRN: [id number]  CC:  Chief Complaint  Patient presents with   Medical Management of Chronic Issues    Pt reports being concerned about her weight. She would like to be considered for weight loss medication.     HPI [name] presents ***  Outpatient Encounter Medications as of 10/03/2024  Medication Sig   amLODipine  (NORVASC ) 5 MG tablet Take 1 tablet (5 mg total) by mouth daily.   meloxicam  (MOBIC ) 15 MG tablet TAKE 1 TABLET (15 MG TOTAL) BY MOUTH AS NEEDED.   metFORMIN  (GLUCOPHAGE -XR) 500 MG 24 hr tablet TAKE 1 TABLET BY MOUTH EVERY DAY WITH BREAKFAST   albuterol  (VENTOLIN  HFA) 108 (90 Base) MCG/ACT inhaler Inhale 1-2 puffs into the lungs as needed for allergies. (Patient not taking: Reported on 10/03/2024)   minoxidil  (MINOXIDIL  FOR WOMEN) 2 % external solution Apply topically 2 (two) times daily.   No facility-administered encounter medications on file as of 10/03/2024.    Past Medical History:  Diagnosis Date   Allergy    Anemia    Anemia    Back pain    DUB (dysfunctional uterine bleeding)    Fibroids    High cholesterol    Hypertension    No pertinent past medical history    Prediabetes    Smoking history 07/12/11   no hx of  smoking   Vertigo    Vitamin D  deficiency     Past Surgical History:  Procedure Laterality Date   ABDOMINAL HYSTERECTOMY     CESAREAN SECTION  2006, 2008   x 2   MYOMECTOMY  2003   TUBAL LIGATION      Family History  Problem Relation Age of Onset   Heart disease Mother        angina   Hypertension Mother    Hyperlipidemia Mother    Cancer Father        prostate   Diabetes Father    Alcoholism Father    Heart disease Maternal Grandmother    Diabetes Maternal Grandfather    Diabetes Paternal Grandfather     Social History   Socioeconomic History   Marital status: Married    Spouse  name: Not on file   Number of children: 2   Years of education: Not on file   Highest education level: Not on file  Occupational History   Occupation: MEDICAL RECORDS MANAGER  Tobacco Use   Smoking status: Former    Current packs/day: 0.50    Average packs/day: 0.5 packs/day for 0.5 years (0.3 ttl pk-yrs)    Types: Cigarettes   Smokeless tobacco: Never  Vaping Use   Vaping status: Never Used  Substance and Sexual Activity   Alcohol use: Yes    Alcohol/week: 0.0 standard drinks of alcohol    Comment: occas   Drug use: No   Sexual activity: Yes    Birth control/protection: Surgical  Other Topics Concern   Not on file  Social History Narrative   Married   Education: College   Exercise: No   Social Drivers of Health   Tobacco Use: Medium Risk (10/03/2024)   Patient History    Smoking Tobacco Use: Former    Smokeless Tobacco Use: Never    Passive Exposure: Not on file  Financial Resource Strain: Low Risk (10/03/2024)   Overall Financial Resource Strain (CARDIA)  Difficulty of Paying Living Expenses: Not hard at all  Food Insecurity: No Food Insecurity (10/03/2024)   Epic    Worried About Programme Researcher, Broadcasting/film/video in the Last Year: Never true    Ran Out of Food in the Last Year: Never true  Transportation Needs: No Transportation Needs (10/03/2024)   Epic    Lack of Transportation (Medical): No    Lack of Transportation (Non-Medical): No  Physical Activity: Sufficiently Active (10/03/2024)   Exercise Vital Sign    Days of Exercise per Week: 7 days    Minutes of Exercise per Session: 30 min  Stress: Stress Concern Present (10/03/2024)   [employer] of Occupational Health - Occupational Stress Questionnaire    Feeling of Stress: Rather much  Social Connections: Socially Integrated (10/03/2024)   Social Connection and Isolation Panel    Frequency of Communication with Friends and Family: More than three times a week    Frequency of Social Gatherings with Friends and  Family: Twice a week    Attends Religious Services: More than 4 times per year    Active Member of Clubs or Organizations: Yes    Attends Banker Meetings: More than 4 times per year    Marital Status: Married  Catering Manager Violence: Not At Risk (10/03/2024)   Epic    Fear of Current or Ex-Partner: No    Emotionally Abused: No    Physically Abused: No    Sexually Abused: No  Depression (PHQ2-9): Low Risk (02/09/2024)   Depression (PHQ2-9)    PHQ-2 Score: 0  Alcohol Screen: Low Risk (10/03/2024)   Alcohol Screen    Last Alcohol Screening Score (AUDIT): 2  Housing: Low Risk (10/03/2024)   Epic    Unable to Pay for Housing in the Last Year: No    Number of Times Moved in the Last Year: 0    Homeless in the Last Year: No  Utilities: Not At Risk (10/03/2024)   Epic    Threatened with loss of utilities: No  Health Literacy: Adequate Health Literacy (10/03/2024)   B1300 Health Literacy    Frequency of need for help with medical instructions: Never    ROS      Objective    BP 118/77   Pulse 90   Ht 5' 5 (1.651 m)   Wt 218 lb 3.2 oz (99 kg)   LMP 06/13/2011   SpO2 95%   BMI 36.31 kg/m   Physical Exam  {Labs (Optional):23779}    Assessment & Plan:   There are no diagnoses linked to this encounter.   No follow-ups on file.   Tanda Raguel SQUIBB, MD

## 2024-10-04 ENCOUNTER — Encounter: Payer: Self-pay | Admitting: Family Medicine

## 2024-10-31 ENCOUNTER — Ambulatory Visit (INDEPENDENT_AMBULATORY_CARE_PROVIDER_SITE_OTHER): Payer: PRIVATE HEALTH INSURANCE | Admitting: Family Medicine

## 2024-10-31 ENCOUNTER — Ambulatory Visit: Payer: Self-pay | Admitting: Family Medicine

## 2024-10-31 ENCOUNTER — Encounter: Payer: Self-pay | Admitting: Family Medicine

## 2024-10-31 VITALS — BP 140/87 | HR 85 | Ht 65.0 in | Wt 215.6 lb

## 2024-10-31 DIAGNOSIS — I10 Essential (primary) hypertension: Secondary | ICD-10-CM

## 2024-10-31 DIAGNOSIS — E6609 Other obesity due to excess calories: Secondary | ICD-10-CM

## 2024-10-31 DIAGNOSIS — E1169 Type 2 diabetes mellitus with other specified complication: Secondary | ICD-10-CM

## 2024-10-31 DIAGNOSIS — E66812 Obesity, class 2: Secondary | ICD-10-CM

## 2024-10-31 DIAGNOSIS — Z7689 Persons encountering health services in other specified circumstances: Secondary | ICD-10-CM

## 2024-10-31 DIAGNOSIS — Z7984 Long term (current) use of oral hypoglycemic drugs: Secondary | ICD-10-CM

## 2024-10-31 DIAGNOSIS — Z6835 Body mass index (BMI) 35.0-35.9, adult: Secondary | ICD-10-CM

## 2024-10-31 LAB — POCT GLYCOSYLATED HEMOGLOBIN (HGB A1C): HbA1c, POC (controlled diabetic range): 7.1 % — AB (ref 0.0–7.0)

## 2024-10-31 MED ORDER — METFORMIN HCL ER 750 MG PO TB24: 750.0000 mg | ORAL_TABLET | Freq: Every day | ORAL | 0 refills | Status: DC

## 2024-10-31 MED ORDER — PHENTERMINE HCL 37.5 MG PO TABS: 37.5000 mg | ORAL_TABLET | Freq: Every day | ORAL | 0 refills | Status: AC

## 2024-10-31 NOTE — Progress Notes (Signed)
 "  Established Patient Office Visit  Subjective    Patient ID: [name], female    DOB: 1971-04-04  Age: 54 y.o. MRN: [id number]  CC:  Chief Complaint  Patient presents with   Medical Management of Chronic Issues    Weight check. Pt reports she is having an ophthalmologic procedure done tomorrow     HPI [name] presents for follow up of diabetes and for weight loss management.   Outpatient Encounter Medications as of 10/31/2024  Medication Sig   amLODipine  (NORVASC ) 5 MG tablet Take 1 tablet (5 mg total) by mouth daily.   metFORMIN  (GLUCOPHAGE -XR) 500 MG 24 hr tablet TAKE 1 TABLET BY MOUTH EVERY DAY WITH BREAKFAST   metFORMIN  (GLUCOPHAGE -XR) 750 MG 24 hr tablet Take 1 tablet (750 mg total) by mouth daily with breakfast.   phentermine  (ADIPEX-P ) 37.5 MG tablet Take 1 tablet (37.5 mg total) by mouth daily before breakfast.   albuterol  (VENTOLIN  HFA) 108 (90 Base) MCG/ACT inhaler Inhale 1-2 puffs into the lungs as needed for allergies. (Patient not taking: Reported on 10/31/2024)   meloxicam  (MOBIC ) 15 MG tablet TAKE 1 TABLET (15 MG TOTAL) BY MOUTH AS NEEDED. (Patient not taking: Reported on 10/31/2024)   No facility-administered encounter medications on file as of 10/31/2024.    Past Medical History:  Diagnosis Date   Allergy    Anemia    Anemia    Back pain    DUB (dysfunctional uterine bleeding)    Fibroids    High cholesterol    Hypertension    No pertinent past medical history    Prediabetes    Smoking history 07/12/11   no hx of  smoking   Vertigo    Vitamin D  deficiency     Past Surgical History:  Procedure Laterality Date   ABDOMINAL HYSTERECTOMY     CESAREAN SECTION  2006, 2008   x 2   MYOMECTOMY  2003   TUBAL LIGATION      Family History  Problem Relation Age of Onset   Heart disease Mother        angina   Hypertension Mother    Hyperlipidemia Mother    Cancer Father        prostate   Diabetes Father    Alcoholism Father     Heart disease Maternal Grandmother    Diabetes Maternal Grandfather    Diabetes Paternal Grandfather     Social History   Socioeconomic History   Marital status: Married    Spouse name: Not on file   Number of children: 2   Years of education: Not on file   Highest education level: Not on file  Occupational History   Occupation: MEDICAL RECORDS MANAGER  Tobacco Use   Smoking status: Former    Current packs/day: 0.50    Average packs/day: 0.5 packs/day for 0.5 years (0.3 ttl pk-yrs)    Types: Cigarettes   Smokeless tobacco: Never  Vaping Use   Vaping status: Never Used  Substance and Sexual Activity   Alcohol use: Yes    Alcohol/week: 0.0 standard drinks of alcohol    Comment: occas   Drug use: No   Sexual activity: Yes    Birth control/protection: Surgical  Other Topics Concern   Not on file  Social History Narrative   Married   Education: College   Exercise: No   Social Drivers of Health   Tobacco Use: Medium Risk (10/31/2024)   Patient History    Smoking Tobacco  Use: Former    Smokeless Tobacco Use: Never    Passive Exposure: Not on Actuary Strain: Low Risk (10/03/2024)   Overall Financial Resource Strain (CARDIA)    Difficulty of Paying Living Expenses: Not hard at all  Food Insecurity: No Food Insecurity (10/03/2024)   Epic    Worried About Programme Researcher, Broadcasting/film/video in the Last Year: Never true    Ran Out of Food in the Last Year: Never true  Transportation Needs: No Transportation Needs (10/03/2024)   Epic    Lack of Transportation (Medical): No    Lack of Transportation (Non-Medical): No  Physical Activity: Sufficiently Active (10/03/2024)   Exercise Vital Sign    Days of Exercise per Week: 7 days    Minutes of Exercise per Session: 30 min  Stress: Stress Concern Present (10/03/2024)   [employer] of Occupational Health - Occupational Stress Questionnaire    Feeling of Stress: Rather much  Social Connections: Socially Integrated  (10/03/2024)   Social Connection and Isolation Panel    Frequency of Communication with Friends and Family: More than three times a week    Frequency of Social Gatherings with Friends and Family: Twice a week    Attends Religious Services: More than 4 times per year    Active Member of Clubs or Organizations: Yes    Attends Banker Meetings: More than 4 times per year    Marital Status: Married  Catering Manager Violence: Not At Risk (10/03/2024)   Epic    Fear of Current or Ex-Partner: No    Emotionally Abused: No    Physically Abused: No    Sexually Abused: No  Depression (PHQ2-9): Low Risk (02/09/2024)   Depression (PHQ2-9)    PHQ-2 Score: 0  Alcohol Screen: Low Risk (10/03/2024)   Alcohol Screen    Last Alcohol Screening Score (AUDIT): 2  Housing: Low Risk (10/03/2024)   Epic    Unable to Pay for Housing in the Last Year: No    Number of Times Moved in the Last Year: 0    Homeless in the Last Year: No  Utilities: Not At Risk (10/03/2024)   Epic    Threatened with loss of utilities: No  Health Literacy: Adequate Health Literacy (10/03/2024)   B1300 Health Literacy    Frequency of need for help with medical instructions: Never    Review of Systems  All other systems reviewed and are negative.       Objective    BP (!) 140/87   Pulse 85   Ht 5' 5 (1.651 m)   Wt 215 lb 9.6 oz (97.8 kg)   LMP 06/13/2011   SpO2 97%   BMI 35.88 kg/m   Physical Exam Vitals and nursing note reviewed.  Constitutional:      General: She is not in acute distress.    Appearance: She is obese.  Cardiovascular:     Rate and Rhythm: Normal rate and regular rhythm.  Pulmonary:     Effort: Pulmonary effort is normal.     Breath sounds: Normal breath sounds.  Abdominal:     Palpations: Abdomen is soft.     Tenderness: There is no abdominal tenderness.  Neurological:     General: No focal deficit present.     Mental Status: She is alert and oriented to person, place, and  time.         Assessment & Plan:   1. Encounter for weight management (Primary) Phentermine  refilled.  2. Class 2 obesity due to excess calories without serious comorbidity with body mass index (BMI) of 35.0 to 35.9 in adult   3. Type 2 diabetes mellitus with other specified complication, without long-term current use of insulin (HCC) A1c is stable and just at goal. Continue. Meds refilled - POCT glycosylated hemoglobin (Hb A1C)  4. Essential hypertension Continue  Return in about 4 months (around 02/28/2025) for follow up.   Tanda Raguel SQUIBB, MD   "

## 2024-11-11 ENCOUNTER — Ambulatory Visit: Payer: Self-pay | Admitting: Pharmacist

## 2024-11-21 ENCOUNTER — Ambulatory Visit: Payer: Self-pay | Admitting: Pharmacist

## 2024-11-21 ENCOUNTER — Encounter: Payer: Self-pay | Admitting: Pharmacist

## 2024-11-21 DIAGNOSIS — Z7984 Long term (current) use of oral hypoglycemic drugs: Secondary | ICD-10-CM

## 2024-11-21 DIAGNOSIS — E1169 Type 2 diabetes mellitus with other specified complication: Secondary | ICD-10-CM

## 2024-11-21 MED ORDER — METFORMIN HCL ER 500 MG PO TB24: 500.0000 mg | ORAL_TABLET | Freq: Every day | ORAL | 1 refills | Status: AC

## 2024-11-21 NOTE — Progress Notes (Signed)
 "   S:     No chief complaint on file.  54 y.o. female who presents for diabetes evaluation, education, and management. Patient arrives in good spirits and presents without  any assistance.   Patient was referred and last seen by Primary Care Provider, Dr. Tanda, on 10/31/2024.   Today, [name] is in good spirits. Her PMH is significant for T2DM, essential HTN, obesity. Patient reports prediabetes was diagnosed in 2021. Initially managed with lifestyle before starting metformin . She increased the dose to 750 mg XR as instructed by Dr. Tanda but experienced GI upset. She denies any issues with the metformin  when she took XR 500 mg once a day with breakfast. Has never been admitted to the hospital for DM-related illness. Has never been insulin. No known hx of clinical ASCVD, CHF, or CKD. No thyroid  cancer hx, no pancreatitis.  Family/Social History:  -Fhx: DM, HTN, hyperlipidemia, hear disease (grandmother) -Tobacco: former smoker (quit 54 YO) -Alcohol: occasionally   Current diabetes medications include: metformin  750 mg XR daily (had to stop after 1 day due to GI upset)  Insurance coverage: [employer]  Patient denies hypoglycemic events. Reported home fasting glucose: no meter   Reported 2 hour post-meal/random glucose: no meter  Patient denies polyuria, polydipsia. Patient denies neuropathy (nerve pain). Patient reports visual changes. Sees Ophthalmologist. Patient denies self foot exams.   Patient reported dietary habits: Eats minimum of 2 meals/day  Patient-reported exercise habits:  -Walks her dog 3 times daily  -Line dancing every Wednesday  -Performs weight baring exercises 2-3x weekly  O:  Lab Results  Component Value Date   HGBA1C 7.1 (A) 10/31/2024   There were no vitals filed for this visit.  Lipid Panel     Component Value Date/Time   CHOL 216 (H) 02/09/2024 0913   TRIG 76 02/09/2024 0913   HDL 49 02/09/2024 0913   CHOLHDL 4.4 02/09/2024 0913    CHOLHDL 3.3 10/07/2015 0914   VLDL 16 10/07/2015 0914   LDLCALC 153 (H) 02/09/2024 0913    Clinical Atherosclerotic Cardiovascular Disease (ASCVD): No  The 10-year ASCVD risk score (Arnett DK, et al., 2019) is: 17.3%   Values used to calculate the score:     Age: 85 years     Clinically relevant sex: Female     Is Non-Hispanic African American: Yes     Diabetic: Yes     Tobacco smoker: No     Systolic Blood Pressure: 140 mmHg     Is BP treated: Yes     HDL Cholesterol: 49 mg/dL     Total Cholesterol: 216 mg/dL   Lab Results  Component Value Date   CREATININE 0.66 02/09/2024   BUN 12 02/09/2024   NA 143 02/09/2024   K 3.9 02/09/2024   CL 105 02/09/2024   CO2 22 02/09/2024    Medications Reviewed Today     Reviewed by Fleeta Tonia Garnette LITTIE, RPH-CPP (Pharmacist) on 11/21/24 at 1437  Med List Status: <None>   Medication Order Taking? Sig Documenting Provider Last Dose Status Informant  albuterol  (VENTOLIN  HFA) 108 (90 Base) MCG/ACT inhaler [id number]  Inhale 1-2 puffs into the lungs as needed for allergies.  Patient not taking: Reported on 10/31/2024   [provider]  Active            Med Note [facility], CONNELL I   Fri Feb 09, 2024  8:37 AM) As needed  amLODipine  (NORVASC ) 5 MG tablet [id number]  Take 1 tablet (5 mg total)  by mouth daily. Tanda Bleacher, MD  Active   meloxicam  (MOBIC ) 15 MG tablet [id number]  TAKE 1 TABLET (15 MG TOTAL) BY MOUTH AS NEEDED.  Patient not taking: Reported on 10/31/2024   Tanda Bleacher, MD  Active   metFORMIN  (GLUCOPHAGE -XR) 500 MG 24 hr tablet [id number] Yes Take 1 tablet (500 mg total) by mouth daily with breakfast. Newlin, Enobong, MD  Active   phentermine  (ADIPEX-P ) 37.5 MG tablet [id number]  Take 1 tablet (37.5 mg total) by mouth daily before breakfast. Tanda Bleacher, MD  Active              Patient is participating in a Managed Medicaid Plan: no   A/P: Diabetes close to goal with A1c of 7.1%. We discussed the A1c criteria for  diabetes diagnosis. I also emphasized a goal A1c of <7%. [name] is not symptomatic from a hyper or hypoglycemic standpoint at this time. She is able to verbalize appropriate hypoglycemia management plan. She experienced side effects with the higher dose of metformin  and we will decrease to metformin  500 mg XR once daily today. I discussed GLP-1 RA therapy with her, emphasizing the cardiorenal and weight loss benefit offered by this class. She would prefer to research Ozempic before deciding whether or not to start it. I will see her again in 4-6 weeks to reassess.  - Discontinued metformin  750 mg XR.  - START metformin  500 mg XR once daily.  - Patient educated on purpose, proper use, and potential adverse effects of Ozempic.  - Extensively discussed pathophysiology of diabetes, recommended lifestyle interventions, dietary effects on glucose control.  - Counseled on s/sx of and management of hypoglycemia.  - Next A1c anticipated 01/2025.   Written patient instructions provided. Patient verbalized understanding of treatment plan.  Total time in face to face counseling 30 minutes.    Follow-up:  Pharmacist visit in 4-6 weeks.  Herlene Fleeta Morris, PharmD, JAQUELINE, CPP Clinical Pharmacist [facility] [phone]   "

## 2024-11-29 ENCOUNTER — Ambulatory Visit: Payer: Self-pay | Admitting: Family Medicine

## 2024-12-04 ENCOUNTER — Encounter: Payer: Self-pay | Admitting: Dietician

## 2024-12-19 ENCOUNTER — Ambulatory Visit: Payer: Self-pay | Admitting: Pharmacist
